# Patient Record
Sex: Male | Born: 1955
Health system: Southern US, Community
[De-identification: ages and names within clinical notes are randomized; demographics above are authoritative.]

## PROBLEM LIST (undated history)

## (undated) DIAGNOSIS — M25561 Pain in right knee: Secondary | ICD-10-CM

## (undated) DIAGNOSIS — E785 Hyperlipidemia, unspecified: Secondary | ICD-10-CM

## (undated) DIAGNOSIS — I10 Essential (primary) hypertension: Secondary | ICD-10-CM

## (undated) HISTORY — DX: Pain in right knee: M25.561

## (undated) HISTORY — DX: Hyperlipidemia, unspecified: E78.5

## (undated) HISTORY — PX: APPENDECTOMY: SHX54

## (undated) HISTORY — DX: Essential (primary) hypertension: I10

## (undated) HISTORY — PX: VASECTOMY: SHX75

---

## 1998-08-06 ENCOUNTER — Encounter: Payer: Self-pay | Admitting: General Surgery

## 1998-08-06 ENCOUNTER — Ambulatory Visit (HOSPITAL_COMMUNITY): Admission: RE | Admit: 1998-08-06 | Discharge: 1998-08-06 | Payer: Self-pay | Admitting: General Surgery

## 1998-08-06 ENCOUNTER — Inpatient Hospital Stay (HOSPITAL_COMMUNITY): Admission: EM | Admit: 1998-08-06 | Discharge: 1998-08-11 | Payer: Self-pay | Admitting: Emergency Medicine

## 1999-10-10 ENCOUNTER — Ambulatory Visit (HOSPITAL_COMMUNITY): Admission: RE | Admit: 1999-10-10 | Discharge: 1999-10-10 | Payer: Self-pay | Admitting: Internal Medicine

## 2001-09-07 ENCOUNTER — Ambulatory Visit (HOSPITAL_COMMUNITY): Admission: RE | Admit: 2001-09-07 | Discharge: 2001-09-07 | Payer: Self-pay | Admitting: Family Medicine

## 2001-09-07 ENCOUNTER — Encounter: Payer: Self-pay | Admitting: Family Medicine

## 2001-09-13 ENCOUNTER — Encounter: Admission: RE | Admit: 2001-09-13 | Discharge: 2001-09-20 | Payer: Self-pay | Admitting: Family Medicine

## 2007-05-27 ENCOUNTER — Ambulatory Visit (HOSPITAL_COMMUNITY): Admission: RE | Admit: 2007-05-27 | Discharge: 2007-05-27 | Payer: Self-pay | Admitting: Gastroenterology

## 2007-10-25 ENCOUNTER — Emergency Department (HOSPITAL_COMMUNITY): Admission: EM | Admit: 2007-10-25 | Discharge: 2007-10-25 | Payer: Self-pay | Admitting: Family Medicine

## 2008-02-10 ENCOUNTER — Ambulatory Visit (HOSPITAL_COMMUNITY): Admission: RE | Admit: 2008-02-10 | Discharge: 2008-02-10 | Payer: Self-pay | Admitting: Orthopedic Surgery

## 2010-02-14 ENCOUNTER — Emergency Department (HOSPITAL_COMMUNITY)
Admission: EM | Admit: 2010-02-14 | Discharge: 2010-02-14 | Payer: Self-pay | Source: Home / Self Care | Admitting: Emergency Medicine

## 2010-02-18 LAB — URINALYSIS, ROUTINE W REFLEX MICROSCOPIC
Bilirubin Urine: NEGATIVE
Hgb urine dipstick: NEGATIVE
Ketones, ur: NEGATIVE mg/dL
Nitrite: NEGATIVE
Protein, ur: NEGATIVE mg/dL
Specific Gravity, Urine: 1.008 (ref 1.005–1.030)
Urine Glucose, Fasting: NEGATIVE mg/dL
Urobilinogen, UA: 0.2 mg/dL (ref 0.0–1.0)
pH: 6.5 (ref 5.0–8.0)

## 2010-02-18 LAB — DIFFERENTIAL
Basophils Absolute: 0 10*3/uL (ref 0.0–0.1)
Basophils Relative: 1 % (ref 0–1)
Eosinophils Absolute: 0.2 10*3/uL (ref 0.0–0.7)
Eosinophils Relative: 3 % (ref 0–5)
Lymphocytes Relative: 40 % (ref 12–46)
Lymphs Abs: 1.9 10*3/uL (ref 0.7–4.0)
Monocytes Absolute: 0.4 10*3/uL (ref 0.1–1.0)
Monocytes Relative: 9 % (ref 3–12)
Neutro Abs: 2.2 10*3/uL (ref 1.7–7.7)
Neutrophils Relative %: 46 % (ref 43–77)

## 2010-02-18 LAB — POCT I-STAT, CHEM 8
BUN: 17 mg/dL (ref 6–23)
Calcium, Ion: 1.11 mmol/L — ABNORMAL LOW (ref 1.12–1.32)
Chloride: 102 mEq/L (ref 96–112)
Creatinine, Ser: 1.3 mg/dL (ref 0.4–1.5)
Glucose, Bld: 158 mg/dL — ABNORMAL HIGH (ref 70–99)
HCT: 41 % (ref 39.0–52.0)
Hemoglobin: 13.9 g/dL (ref 13.0–17.0)
Potassium: 3.7 mEq/L (ref 3.5–5.1)
Sodium: 137 mEq/L (ref 135–145)
TCO2: 25 mmol/L (ref 0–100)

## 2010-02-18 LAB — CBC
HCT: 38.7 % — ABNORMAL LOW (ref 39.0–52.0)
Hemoglobin: 13.9 g/dL (ref 13.0–17.0)
MCH: 31.6 pg (ref 26.0–34.0)
MCHC: 35.9 g/dL (ref 30.0–36.0)
MCV: 88 fL (ref 78.0–100.0)
Platelets: 222 10*3/uL (ref 150–400)
RBC: 4.4 MIL/uL (ref 4.22–5.81)
RDW: 11.7 % (ref 11.5–15.5)
WBC: 4.7 10*3/uL (ref 4.0–10.5)

## 2010-02-18 LAB — GLUCOSE, CAPILLARY: Glucose-Capillary: 149 mg/dL — ABNORMAL HIGH (ref 70–99)

## 2015-03-09 MED FILL — ZOLPIDEM TARTRATE 5 MG TAB: 5 | 30 days supply | Qty: 30 | Fill #0

## 2015-03-16 MED FILL — VALSARTAN 160 MG TABLET: 160 | 90 days supply | Qty: 90 | Fill #0

## 2015-05-02 MED FILL — HYDROCHLOROTHIAZIDE 12.5 MG: 12.5 | 90 days supply | Qty: 90 | Fill #0

## 2015-05-02 MED FILL — AMLODIPINE BESYLATE 5 MG TA: 5 | 90 days supply | Qty: 90 | Fill #0

## 2015-05-04 DIAGNOSIS — H524 Presbyopia: Secondary | ICD-10-CM | POA: Diagnosis not present

## 2015-05-04 DIAGNOSIS — H5213 Myopia, bilateral: Secondary | ICD-10-CM | POA: Diagnosis not present

## 2015-05-04 DIAGNOSIS — H52223 Regular astigmatism, bilateral: Secondary | ICD-10-CM | POA: Diagnosis not present

## 2015-06-11 DIAGNOSIS — E782 Mixed hyperlipidemia: Secondary | ICD-10-CM | POA: Diagnosis not present

## 2015-06-11 DIAGNOSIS — G4700 Insomnia, unspecified: Secondary | ICD-10-CM | POA: Diagnosis not present

## 2015-06-11 DIAGNOSIS — Z0000 Encounter for general adult medical examination without abnormal findings: Secondary | ICD-10-CM | POA: Diagnosis not present

## 2015-06-11 DIAGNOSIS — I10 Essential (primary) hypertension: Secondary | ICD-10-CM | POA: Diagnosis not present

## 2015-06-11 DIAGNOSIS — Z125 Encounter for screening for malignant neoplasm of prostate: Secondary | ICD-10-CM | POA: Diagnosis not present

## 2015-06-11 DIAGNOSIS — B360 Pityriasis versicolor: Secondary | ICD-10-CM | POA: Diagnosis not present

## 2015-06-11 DIAGNOSIS — Z91030 Bee allergy status: Secondary | ICD-10-CM | POA: Diagnosis not present

## 2015-06-11 DIAGNOSIS — N529 Male erectile dysfunction, unspecified: Secondary | ICD-10-CM | POA: Diagnosis not present

## 2015-06-11 DIAGNOSIS — Z79899 Other long term (current) drug therapy: Secondary | ICD-10-CM | POA: Diagnosis not present

## 2015-06-11 MED FILL — ROSUVASTATIN CALCIUM 20 MG: 20 | 90 days supply | Qty: 90 | Fill #0

## 2015-06-11 MED FILL — VALSARTAN 160 MG TABLET: 160 | 90 days supply | Qty: 90 | Fill #0

## 2015-06-11 MED FILL — EPINEPHRINE 0.3 MG AUTO-INJ: 0.3 | 30 days supply | Qty: 2 | Fill #0

## 2015-07-26 MED FILL — HYDROCHLOROTHIAZIDE 12.5 MG: 12.5 | 90 days supply | Qty: 90 | Fill #1

## 2015-08-23 MED FILL — AMLODIPINE BESYLATE 5 MG TA: 5 | 90 days supply | Qty: 90 | Fill #1

## 2015-09-14 MED FILL — VALSARTAN 160 MG TABLET: 160 | 90 days supply | Qty: 90 | Fill #1

## 2015-09-14 MED FILL — ZOLPIDEM TARTRATE 5 MG TAB: 5 | 30 days supply | Qty: 30 | Fill #0

## 2015-09-20 DIAGNOSIS — S83241A Other tear of medial meniscus, current injury, right knee, initial encounter: Secondary | ICD-10-CM | POA: Diagnosis not present

## 2015-09-20 DIAGNOSIS — M7711 Lateral epicondylitis, right elbow: Secondary | ICD-10-CM | POA: Diagnosis not present

## 2015-09-25 ENCOUNTER — Ambulatory Visit: Payer: 59 | Attending: Sports Medicine | Admitting: Physical Therapy

## 2015-09-25 ENCOUNTER — Other Ambulatory Visit (HOSPITAL_COMMUNITY): Payer: Self-pay | Admitting: Sports Medicine

## 2015-09-25 ENCOUNTER — Encounter: Payer: Self-pay | Admitting: Physical Therapy

## 2015-09-25 DIAGNOSIS — M25561 Pain in right knee: Secondary | ICD-10-CM

## 2015-09-25 DIAGNOSIS — M6281 Muscle weakness (generalized): Secondary | ICD-10-CM | POA: Diagnosis present

## 2015-09-25 DIAGNOSIS — M25621 Stiffness of right elbow, not elsewhere classified: Secondary | ICD-10-CM | POA: Insufficient documentation

## 2015-09-25 DIAGNOSIS — M25521 Pain in right elbow: Secondary | ICD-10-CM | POA: Diagnosis present

## 2015-09-25 NOTE — Therapy (Signed)
[facility], [place], [address] Phone: [phone]   Fax:  [phone]  Physical Therapy Evaluation  Patient Details  Name: Chris Russell MRN: [id number] Date of Birth: 1955/02/14 Referring Provider: Dr Berle Mull   Encounter Date: 09/25/2015      PT End of Session - 09/25/15 1656    Visit Number 1   Number of Visits 12   Date for PT Re-Evaluation 11/06/15   Authorization Type [facility]    PT Start Time 1100   PT Stop Time 1147   PT Time Calculation (min) 47 min   Activity Tolerance Patient tolerated treatment well   Behavior During Therapy [facility] for tasks assessed/performed      Past Medical History:  Diagnosis Date  . Right knee pain     History reviewed. No pertinent surgical history.  There were no vitals filed for this visit.       Subjective Assessment - 09/25/15 1109    Subjective Patient has a history of right lateral elbow pain. Heused to play racquet ball. In the past the pain resolved. This past episode the pain has not resolved. He has pain when he is using his arm for the mouse at work. He is a Software engineer at [employer].     Pertinent History Pat history of lateral epicondylitis    Limitations Other (comment)  Using right upper extremity    How long can you sit comfortably? N/A    How long can you stand comfortably? N/A    How long can you walk comfortably? N/A    Diagnostic tests X-ray: (-) per patient    Patient Stated Goals Pain free and an exercise program    Currently in Pain? Yes   Pain Score 3    Pain Location Elbow   Pain Orientation Right   Pain Descriptors / Indicators Aching   Pain Type Acute pain  acute onset of pain    Pain Onset More than a month ago   Pain Frequency Intermittent   Aggravating Factors  use of the right elbow    Pain Relieving Factors rest    Effect of Pain on Daily Activities difficulty using his right arm at work             [facility] PT  Assessment - 09/25/15 0001      Assessment   Medical Diagnosis Right lateral epicondylitis    Referring Provider Dr Berle Mull    Onset Date/Surgical Date --  > 2 months prior    Hand Dominance Right   Next MD Visit --  Nothing for the elbow    Prior Therapy Not for elbow      Precautions   Precautions None     Restrictions   Weight Bearing Restrictions No     Balance Screen   Has the patient fallen in the past 6 months No     Home Environment   Additional Comments Nothing significant      Prior Function   Level of Independence Independent   Vocation Full time employment     Observation/Other Assessments   Observations Nothing signficant      Sensation   Light Touch Appears Intact   Additional Comments no radicualr signs      Posture/Postural Control   Posture Comments good posture      ROM / Strength   AROM / PROM / Strength AROM;PROM;Strength     AROM   Overall AROM Comments Pain  with active end range wrist extension; 75 degrees of active right lebow supination      PROM   Overall PROM Comments WNL passive ROM      Strength   Overall Strength Comments right: wrist extension 4/5; flexion 5/5 supination 4+/5 Grip R 60lbs  L 80lbs      Palpation   Palpation comment tenderness to palpation in the lateral epicondyle and psoteriro elbow                    [facility] Adult PT Treatment/Exercise - 09/25/15 0001      Wrist Exercises   Other wrist exercises wrist flexion 2x10 2lb wrist extension 2x10 1lb; supination/ pronation 2lb 2x10; towel squeeze 2x10; lateral epicondyle stretch ;                 PT Education - 09/25/15 1656    Education provided Yes   Education Details HEP; activity modification; use of iontophoresis   Person(s) Educated Patient   Methods Explanation   Comprehension Verbalized understanding;Returned demonstration          PT Short Term Goals - 09/25/15 1657      PT SHORT TERM GOAL #1   Title Patient will  demsotrate full pain free active ROM of the right upper extremity   Time 3   Period Weeks   Status New     PT SHORT TERM GOAL #2   Title Patient will demsotrate 5/5 right upper extremity strength    Time 3   Period Weeks   Status New     PT SHORT TERM GOAL #3   Title Patient will increase grip strength on the right to 70 lbs    Time 3   Period Weeks   Status New     PT SHORT TERM GOAL #4   Title Patient will be independent with initial HEP    Time 3   Period Weeks   Status New           PT Long Term Goals - 09/25/15 1700      PT LONG TERM GOAL #1   Title Patient will return to full work out program without pain    Time 6   Period Weeks   Status New     PT LONG TERM GOAL #2   Title Patient will use his right upper extremity for daily tasks without pain   Time 6   Period Weeks   Status New     PT LONG TERM GOAL #3   Title Patient will demsotrate an 80lb grip on the left in order to carry items without pain   Time 6   Period Weeks   Status New               Plan - 09/25/15 1409    Clinical Impression Statement Patient is a 60 year old male with a history of right lateral elbow pain which has become more consistent over the past few months. His pain increase with use of her right arm. He has limitation s in strength and small limitations with pain free active rang of motion. He would benefit from skilled therapy to decrease pain. he would also benefit from an exercise program that helps him return to an active lifestyle without increased elow pain.    Rehab Potential Excellent   PT Frequency 2x / week   PT Duration 8 weeks   PT Treatment/Interventions ADLs/Self Care Home Management;Cryotherapy;Electrical Stimulation;Iontophoresis 4mg /ml Dexamethasone;Moist Heat;Ultrasound;Gait  training;Stair training;Functional mobility training;Patient/family education;Neuromuscular re-education;Therapeutic exercise;Therapeutic activities;Passive range of motion;Manual  techniques;Dry needling;Taping;Splinting   PT Next Visit Plan continue Iontophoresis; Continue with manual therapy; add band exercises for pushing and pulling. consider velcro board fro pronation/ supination and ring/ middle finger extension; Dry needling were needed.    PT Home Exercise Plan wrist flexion, extension, elbow supination, towel squeeze    Consulted and Agree with Plan of Care Patient      Patient will benefit from skilled therapeutic intervention in order to improve the following deficits and impairments:  Pain, Decreased strength  Visit Diagnosis: Pain in right elbow - Plan: PT PLAN OF CARE CERT/RE-CERT  Muscle weakness (generalized) - Plan: PT PLAN OF CARE CERT/RE-CERT  Stiffness of right elbow, not elsewhere classified - Plan: PT PLAN OF CARE CERT/RE-CERT     Problem List There are no active problems to display for this patient.   Carney Living PT DPT  09/25/2015, 5:11 PM  [facility] [address] [place], [place], [address] Phone: [phone]   Fax:  [phone]  Name: Chris Russell MRN: [id number] Date of Birth: 1955/03/26

## 2015-10-02 ENCOUNTER — Ambulatory Visit (HOSPITAL_COMMUNITY)
Admission: RE | Admit: 2015-10-02 | Discharge: 2015-10-02 | Disposition: A | Discharge status: Home / Self Care | Payer: 59 | Source: Ambulatory Visit | Attending: Sports Medicine | Admitting: Sports Medicine

## 2015-10-02 ENCOUNTER — Ambulatory Visit: Payer: 59 | Admitting: Physical Therapy

## 2015-10-02 DIAGNOSIS — M25461 Effusion, right knee: Secondary | ICD-10-CM | POA: Insufficient documentation

## 2015-10-02 DIAGNOSIS — M25621 Stiffness of right elbow, not elsewhere classified: Secondary | ICD-10-CM | POA: Diagnosis not present

## 2015-10-02 DIAGNOSIS — M6281 Muscle weakness (generalized): Secondary | ICD-10-CM | POA: Diagnosis not present

## 2015-10-02 DIAGNOSIS — M94261 Chondromalacia, right knee: Secondary | ICD-10-CM | POA: Diagnosis not present

## 2015-10-02 DIAGNOSIS — M238X1 Other internal derangements of right knee: Secondary | ICD-10-CM | POA: Insufficient documentation

## 2015-10-02 DIAGNOSIS — M25561 Pain in right knee: Secondary | ICD-10-CM | POA: Diagnosis present

## 2015-10-02 DIAGNOSIS — M25521 Pain in right elbow: Secondary | ICD-10-CM | POA: Diagnosis not present

## 2015-10-02 NOTE — Therapy (Signed)
[place], [place], [address] Phone: [phone]   Fax:  [phone]  Physical Therapy Treatment  Patient Details  Name: Chris Russell MRN: [id number] Date of Birth: [birth year] Referring Provider: Dr Berle Mull   Encounter Date: 10/02/2015      PT End of Session - 10/02/15 0845    Visit Number 2   Number of Visits 12   Date for PT Re-Evaluation 11/06/15   Authorization Type [facility]    PT Start Time 0845   PT Stop Time 0944   PT Time Calculation (min) 59 min   Activity Tolerance Patient tolerated treatment well   Behavior During Therapy [facility] for tasks assessed/performed      Past Medical History:  Diagnosis Date  . Right knee pain     No past surgical history on file.  There were no vitals filed for this visit.      Subjective Assessment - 10/02/15 0847    Subjective I usually have pain while I am using the computer and working.  I have no pain at rest. I have had tennis elbow playing racquetball in my past. I no longer play.  20 years ago   Pertinent History Pat history of lateral epicondylitis    Diagnostic tests X-ray: (-) per patient    Patient Stated Goals Pain free and an exercise program    Currently in Pain? Yes   Pain Score 2    Pain Location Elbow   Pain Orientation Right   Pain Descriptors / Indicators Aching   Pain Type Acute pain   Pain Onset More than a month ago   Pain Frequency Intermittent                         [facility] Adult PT Treatment/Exercise - 10/02/15 0858      Wrist Exercises   Other wrist exercises wrist flexion 2x10 2lb wrist extension 2x10 2lb; supination/ pronation 2lb 2x10; towel squeeze 2x10; lateral epicondyle stretch 15 - 30 seconds x 3 ;      Modalities   Modalities Moist Heat     Moist Heat Therapy   Number Minutes Moist Heat 15 Minutes   Moist Heat Location Elbow  right     Manual Therapy   Manual Therapy Soft tissue  mobilization   Soft tissue mobilization IASTYM to right wrist common extensors, triceps, supinator/pronator          Trigger Point Dry Needling - 10/02/15 0859    Consent Given? Yes   Education Handout Provided Yes  twitch response for R wrist common ext   Muscles Treated Upper Body --  right triceps , wrist common extensors              PT Education - 10/02/15 0857    Education provided Yes   Education Details Trigger point dry needling aftercare / precautians  review of exericise   Person(s) Educated Patient   Methods Explanation;Handout   Comprehension Verbalized understanding          PT Short Term Goals - 10/02/15 1048      PT SHORT TERM GOAL #1   Title Patient will demsotrate full pain free active ROM of the right upper extremity   Time 3   Period Weeks   Status On-going     PT SHORT TERM GOAL #2   Title Patient will demsotrate 5/5 right upper extremity strength    Time 3  Period Weeks   Status On-going     PT SHORT TERM GOAL #3   Title Patient will increase grip strength on the right to 70 lbs    Time 3   Period Weeks   Status On-going     PT SHORT TERM GOAL #4   Title Patient will be independent with initial HEP    Time 3   Period Weeks   Status On-going           PT Long Term Goals - 09/25/15 1700      PT LONG TERM GOAL #1   Title Patient will return to full work out program without pain    Time 6   Period Weeks   Status New     PT LONG TERM GOAL #2   Title Patient will use his right upper extremity for daily tasks without pain   Time 6   Period Weeks   Status New     PT LONG TERM GOAL #3   Title Patient will demsotrate an 80lb grip on the left in order to carry items without pain   Time 6   Period Weeks   Status New               Plan - 10/02/15 [id number]    Clinical Impression Statement Pt returns with same symptoms of right lateral epicondylitis.  Pt consents to trigger point dry needling for Right common wrist  extensors, supinator/pronator and triceps on right.  Pt  was closely monitored throughtout session.  Pt reviewd initial HEP wit 2 lb wts.  Will continue trial of dry needling for next 2 sessions before returning to [facility] PT.     Rehab Potential Excellent   PT Frequency 2x / week   PT Duration 8 weeks   PT Treatment/Interventions ADLs/Self Care Home Management;Cryotherapy;Electrical Stimulation;Iontophoresis 4mg /ml Dexamethasone;Moist Heat;Ultrasound;Gait training;Stair training;Functional mobility training;Patient/family education;Neuromuscular re-education;Therapeutic exercise;Therapeutic activities;Passive range of motion;Manual techniques;Dry needling;Taping;Splinting   PT Next Visit Plan Continue with dry needling and assess next visit Continue with manual therapy; add band exercises for pushing and pulling. consider velcro board fro pronation/ supination and ring/ middle finger extension; Dry needling were needed.    PT Home Exercise Plan wrist flexion, extension, elbow supination, towel squeeze    Consulted and Agree with Plan of Care Patient      Patient will benefit from skilled therapeutic intervention in order to improve the following deficits and impairments:  Pain, Decreased strength  Visit Diagnosis: Pain in right elbow  Muscle weakness (generalized)  Stiffness of right elbow, not elsewhere classified     Problem List There are no active problems to display for this patient.  Voncille Lo, PT 10/02/15 10:50 AM Phone: [phone] Fax: [facility] [address] [place], [place], [address] Phone: [phone]   Fax:  [phone]  Name: Chris Russell MRN: [id number] Date of Birth: [birth year]

## 2015-10-02 NOTE — Patient Instructions (Signed)
Trigger Point Dry Needling  . What is Trigger Point Dry Needling (DN)? o DN is a physical therapy technique used to treat muscle pain and dysfunction. Specifically, DN helps deactivate muscle trigger points (muscle knots).  o A thin filiform needle is used to penetrate the skin and stimulate the underlying trigger point. The goal is for a local twitch response (LTR) to occur and for the trigger point to relax. No medication of any kind is injected during the procedure.   . What Does Trigger Point Dry Needling Feel Like?  o The procedure feels different for each individual patient. Some patients report that they do not actually feel the needle enter the skin and overall the process is not painful. Very mild bleeding may occur. However, many patients feel a deep cramping in the muscle in which the needle was inserted. This is the local twitch response.   Marland Kitchen How Will I feel after the treatment? o Soreness is normal, and the onset of soreness may not occur for a few hours. Typically this soreness does not last longer than two days.  o Bruising is uncommon, however; ice can be used to decrease any possible bruising.  o In rare cases feeling tired or nauseous after the treatment is normal. In addition, your symptoms may get worse before they get better, this period will typically not last longer than 24 hours.   . What Can I do After My Treatment? o Increase your hydration by drinking more water for the next 24 hours. o You may place ice or heat on the areas treated that have become sore, however, do not use heat on inflamed or bruised areas. Heat often brings more relief post needling. o You can continue your regular activities, but vigorous activity is not recommended initially after the treatment for 24 hours. o DN is best combined with other physical therapy such as strengthening, stretching, and other therapies.     Chris Russell, PT 10/02/15 8:49 AM Phone: [phone] Fax:  [phone]

## 2015-10-03 MED FILL — IBUPROFEN 600 MG TABLET: 600 | 20 days supply | Qty: 60 | Fill #0

## 2015-10-11 ENCOUNTER — Encounter: Payer: 59 | Admitting: Physical Therapy

## 2015-10-11 ENCOUNTER — Ambulatory Visit: Payer: 59 | Attending: Sports Medicine | Admitting: Physical Therapy

## 2015-10-11 DIAGNOSIS — M25621 Stiffness of right elbow, not elsewhere classified: Secondary | ICD-10-CM | POA: Insufficient documentation

## 2015-10-11 DIAGNOSIS — M25521 Pain in right elbow: Secondary | ICD-10-CM | POA: Diagnosis not present

## 2015-10-11 DIAGNOSIS — M25561 Pain in right knee: Secondary | ICD-10-CM | POA: Insufficient documentation

## 2015-10-11 DIAGNOSIS — M6281 Muscle weakness (generalized): Secondary | ICD-10-CM | POA: Insufficient documentation

## 2015-10-11 NOTE — Patient Instructions (Addendum)
[email or web address]  Tennis Elbow  Using flex  Bar.  Get a red or green flex bar. I own a red.  Elbow Extension: Resisted    With tubing wrapped around left fist and other end anchored, straighten elbow. Repeat __15__ times per set. Do __2__ sets per session. Do _2___ sessions per day.  Copyright  VHI. All rights reserved.     Copyright  VHI. All rights reserved.  HIP: Flexion / KNEE: Extension, Straight Leg Raise    All knee exericses should be pain free.   Raise leg, keeping knee straight. Perform slowly. _15__ reps per set, __2_ sets per day, _7__ days per week   Copyright  VHI. All rights reserved.  Heel Slide   Bend knee and pull heel toward buttocks. Hold _3-5___ seconds. Return. Repeat with other knee. Repeat __15__ times. Do _1-2___ sessions per day.  http://[email or web address].exer.us/372   Copyright  VHI. All rights reserved.     Raise leg until knee is straight. _15__ reps per set, _2__ sets per day, _7__ days per week   Copyright  VHI. All rights reserved.  [employer] a large can or rolled towel under leg. Straighten knee and leg. Hold _5___ seconds. Repeat with other leg. Repeat _15 x2___ times.  http://[email or web address].exer.us/365   Copyright  VHI. All rights reserved.  Quad Set   Slowly tighten muscles on thigh of straight leg while counting out loud to _5___. Repeat with other leg. Repeat __15 x 2__ times. Do __2__ sessions per day. When watching TV or reading remember to do quad sets.  Leg Extension (Hamstring)   Sit toward front edge of chair, with leg out straight, heel on floor, toes pointing toward body. Keeping back straight, bend forward at hip, breathing out through pursed lips. Return, breathing in. Repeat __2-3_ times. Repeat with other leg. Do _1-2__ sessions per day. Chris Russell the baby Variation: Perform from standing position, with support.     Hamstring Stretch   With other leg bent, foot flat, grasp right leg and  slowly try to straighten knee. Hold _30___ seconds. Repeat _2-3___ times. Do _1-2___ sessions per day.  http://[email or web address].exer.us/279   Hamstring Stretch (Standing)   Standing, place one heel on chair or bench. Use one or both hands on thigh for support. Keeping torso straight, lean forward slowly until a stretch is felt in back of same thigh. Hold ____ seconds. Repeat with other leg.  IONTOPHORESIS PATIENT PRECAUTIONS & CONTRAINDICATIONS:  . Redness under one or both electrodes can occur.  This characterized by a uniform redness that usually disappears within 12 hours of treatment. . Small pinhead size blisters may result in response to the drug.  Contact your physician if the problem persists more than 24 hours. . On rare occasions, iontophoresis therapy can result in temporary skin reactions such as rash, inflammation, irritation or burns.  The skin reactions may be the result of individual sensitivity to the ionic solution used, the condition of the skin at the start of treatment, reaction to the materials in the electrodes, allergies or sensitivity to dexamethasone, or a poor connection between the patch and your skin.  Discontinue using iontophoresis if you have any of these reactions and report to your therapist. . Remove the Patch or electrodes if you have any undue sensation of pain or burning during the treatment and report discomfort to your therapist. . Tell your Therapist if you have had known adverse reactions to the application of electrical current. . If using the Patch,  the LED light will turn off when treatment is complete and the patch can be removed.  Approximate treatment time is 1-3 hours.  Remove the patch when light goes off or after 6 hours. . The Patch can be worn during normal activity, however excessive motion where the electrodes have been placed can cause poor contact between the skin and the electrode or uneven electrical current resulting in greater risk of skin  irritation. Marland Kitchen Keep out of the reach of children.   . DO NOT use if you have a cardiac pacemaker or any other electrically sensitive implanted device. . DO NOT use if you have a known sensitivity to dexamethasone. . DO NOT use during Magnetic Resonance Imaging (MRI). . DO NOT use over broken or compromised skin (e.g. sunburn, cuts, or acne) due to the increased risk of skin reaction. . DO NOT SHAVE over the area to be treated:  To establish good contact between the Patch and the skin, excessive hair may be clipped. . DO NOT place the Patch or electrodes on or over your eyes, directly over your heart, or brain. . DO NOT reuse the Patch or electrodes as this may cause burns to occur.            Copyright  VHI. All rights reserved.  Chris Russell, PT 10/11/15 11:20 AM Phone: [phone] Fax: [phone]

## 2015-10-11 NOTE — Therapy (Signed)
[place], [place], [address] Phone: [phone]   Fax:  [phone]  Physical Therapy Treatment/re evaluation   Patient Details  Name: Chris Russell MRN: [id number] Date of Birth: 03-05-1955 Referring Provider: Dr. Berle Mull  Encounter Date: 10/11/2015      PT End of Session - 10/11/15 1350    Visit Number 3   Number of Visits 15   Date for PT Re-Evaluation 11/22/15   Authorization Type [facility]    PT Start Time 1100   PT Stop Time 1150   PT Time Calculation (min) 50 min   Activity Tolerance Patient tolerated treatment well   Behavior During Therapy [facility] for tasks assessed/performed      Past Medical History:  Diagnosis Date  . Right knee pain     No past surgical history on file.  There were no vitals filed for this visit.      Subjective Assessment - 10/11/15 1123    Subjective My knee has been bothering me for 2 months.  I had an MRI August 29th for knee.  I have R Knee OA and MCL sprain. My arm is better but pain at triceps today   Pertinent History Pat history of lateral epicondylitis    How long can you sit comfortably? N/A    How long can you stand comfortably? 8 hours but I feel it at end of day   Currently in Pain? Yes   Pain Score 1    Pain Location Elbow   Pain Orientation Right   Pain Descriptors / Indicators Aching   Pain Type Acute pain   Pain Onset More than a month ago   Multiple Pain Sites Yes   Pain Score 8   Pain Location Knee  MCL strain   Pain Orientation Right   Pain Descriptors / Indicators Sore;Sharp   Pain Type Acute pain   Pain Onset More than a month ago   Pain Frequency Intermittent   Aggravating Factors  moving my knee in certain positions at an angle,  I can go up and down stairs.  mostly angles it hurts            [facility] PT Assessment - 10/11/15 1139      Assessment   Medical Diagnosis Right lateral epicondylitis and Left  knee OA and MCL  sprain   Referring Provider Dr. Berle Mull   Hand Dominance Right   Prior Therapy currently for elbow      Precautions   Precautions None     Restrictions   Weight Bearing Restrictions No     Balance Screen   Has the patient fallen in the past 6 months No     Prior Function   Level of Independence Independent   Vocation Full time employment  desk work     Cognition   Overall Cognitive Status Within Functional Limits for tasks assessed     AROM   Overall AROM Comments No pain with right common writs extensors but pain with triceps insertiion   Right Knee Extension 0   Right Knee Flexion 135  ERP pain over MCL   Left Knee Extension 0   Left Knee Flexion 142     Strength   Overall Strength Within functional limits for tasks performed   Right Hip Flexion 5/5   Right Hip Extension 4+/5   Right Hip ABduction 4+/5   Left Hip Flexion 5/5   Left Hip Extension 4+/5  Left Hip ABduction 5/5   Right Knee Flexion 5/5   Right Knee Extension 4/5  limited by minimal pain but can shoot up with certain moveme   Left Knee Flexion 5/5   Left Knee Extension 5/5     Palpation   Palpation comment tenderness over right triceps insertion and right knee MCL                     [facility] Adult PT Treatment/Exercise - 10/11/15 1336      Self-Care   Self-Care Other Self-Care Comments   Other Self-Care Comments  use of flex bar and given web site  for education and use at home ([employer] TWIST)     Elbow Exercises   Wrist Extension Limitations given flex bar to use for eccentric control of wrist common extensors x 10     Knee/Hip Exercises: Seated   Long Arc Quad Right;Strengthening;15 reps   Heel Slides Right;10 reps;Strengthening     Knee/Hip Exercises: Supine   Quad Sets 20 reps;Right;Strengthening   Short Arc Quad Sets 15 reps   Heel Slides 15 reps;Right;Strengthening;AROM   Straight Leg Raises 15 reps;Strengthening;Right     Wrist Exercises   Other wrist exercises  triceps with green t band 15 x  over head      Iontophoresis   Type of Iontophoresis Dexamethasone   Location right MCL   Dose 1cc   Time 4 hour patch to be removed by pt after 4 hours                PT Education - 10/11/15 1345    Education provided Yes   Education Details POC, explanation of findings for Right knee pain and intial HEP and iontophoresis explanation and home use with handout   Person(s) Educated Patient   Methods Explanation;Demonstration;Handout;Tactile cues;Verbal cues   Comprehension Verbalized understanding;Returned demonstration          PT Short Term Goals - 10/11/15 1357      PT SHORT TERM GOAL #1   Title Patient will demsotrate full pain free active ROM of the right upper extremity   Baseline 1/10   Time 3   Period Weeks   Status On-going     PT SHORT TERM GOAL #2   Title Patient will demsotrate 5/5 right upper extremity strength    Time 3   Period Weeks   Status On-going     PT SHORT TERM GOAL #3   Title Patient will increase grip strength on the right to 70 lbs    Baseline to be taken next visit   Time 3   Period Weeks   Status On-going     PT SHORT TERM GOAL #4   Title Patient will be independent with initial HEP for knee and right wrist/elbow   Time 3   Period Weeks   Status On-going           PT Long Term Goals - 10/11/15 1358      PT LONG TERM GOAL #1   Title Patient will return to full work out program without pain in elbow   Time 6   Period Weeks   Status New     PT LONG TERM GOAL #2   Title Patient will use his right upper extremity for daily tasks without pain   Time 6   Period Weeks   Status On-going     PT LONG TERM GOAL #3   Title Patient will demsotrate an 80lb  grip on the left in order to carry items without pain   Time 6   Period Weeks   Status On-going     PT LONG TERM GOAL #4   Title Pt will be independent with advanced HEP  for knee   Time 6   Period Weeks   Status New     PT LONG TERM  GOAL #5   Title Pt will be able to demonstrate cutting manuevers without illicting pain in right knee to return to active lifestyle   Time 6   Period Weeks   Status New               Plan - 10/11/15 1351    Clinical Impression Statement Pt returns to clinic with improved R lateral epicondylitis symptoms 1/10 but triceps insertion pain. continuing treatment for right tennis elbow. Pt is reevaluated to add Right knee pain.  Pt has MRI that showed R OA and MCL Sprain.  Pt presents with pain on medial knee and complains of pain when moving knee in  diagonals.  He is able to participate in exericise and walking/ activities but has noticed increasing pain over last 2 months.  Pt does not note any speciific incident but would like to improve function and be able to move painfree. Pt would like to be able to return to activity without knee pain/ cutting manuevers and to have decreased pain at the end of the day. After sitting for work. Will continue with skilled PT to increase strength and neuroreeducation of right elbow and knee for maximum function   Rehab Potential Excellent   PT Frequency 2x / week   PT Duration 6 weeks   PT Treatment/Interventions ADLs/Self Care Home Management;Cryotherapy;Electrical Stimulation;Iontophoresis 4mg /ml Dexamethasone;Moist Heat;Ultrasound;Gait training;Stair training;Functional mobility training;Patient/family education;Neuromuscular re-education;Therapeutic exercise;Therapeutic activities;Passive range of motion;Manual techniques;Dry needling;Taping;Splinting   PT Next Visit Plan Continue with increasing strength for right common extensors and right knee to optimal function for MCL sprain.  Assess Iontophoresis benefit   PT Home Exercise Plan Level 1 knee exercise.  Eccentric flex bar exericises for common wrist extensors   Consulted and Agree with Plan of Care Patient      Patient will benefit from skilled therapeutic intervention in order to improve the  following deficits and impairments:  Pain, Decreased strength, Decreased range of motion  Visit Diagnosis: Pain in right elbow  Muscle weakness (generalized)  Stiffness of right elbow, not elsewhere classified  Pain in right knee     Problem List There are no active problems to display for this patient.  Voncille Lo, PT 10/11/15 2:02 PM Phone: [phone] Fax: [phone]  By signing I understand that I am ordering/authorizing the use of Iontophoresis using 4 mg/mL of dexamethasone as a component of this plan of care.  [place], [place], [address] Phone: [phone]   Fax:  [phone]  Name: Chris Russell MRN: [id number] Date of Birth: [birth year]

## 2015-10-16 ENCOUNTER — Ambulatory Visit: Payer: 59 | Admitting: Physical Therapy

## 2015-10-16 DIAGNOSIS — M6281 Muscle weakness (generalized): Secondary | ICD-10-CM

## 2015-10-16 DIAGNOSIS — M25621 Stiffness of right elbow, not elsewhere classified: Secondary | ICD-10-CM | POA: Diagnosis not present

## 2015-10-16 DIAGNOSIS — M25521 Pain in right elbow: Secondary | ICD-10-CM

## 2015-10-16 DIAGNOSIS — M25561 Pain in right knee: Secondary | ICD-10-CM

## 2015-10-16 NOTE — Patient Instructions (Signed)
   HIP: Abduction / External Rotation (Band)   Place band around knees. Lie on side with hips and knees bent. Raise top knee up, squeezing glutes. Keep feet together. Hold _5__ seconds. Use __Green ______ band. _15__ reps per set, _1_ sets per day, _6-7__ days per week  Bridge   Lie back, legs bent. Inhale, pressing hips up. Keeping ribs in, lengthen lower back. Exhale, rolling down along spine from top. Repeat _15___ times. Do _1___ sessions per day. You can add a 5 lb wt on hips   Bridging (Single Leg)   Lie on back with feet shoulder width apart and right leg straight. Lift hips toward the ceiling while keeping leg straight. Hold _5___ seconds. Repeat _15___ times. Do _1___ sessions per day.  http://[email or web address].exer.us/358   EXTENSION: Prone - Knee Flexed (Active)   Lie on stomach, right knee bent to 90. Lift leg toward ceiling. Use 5___ lbs. Complete _1__ sets of _15__ repetitions. Perform _1__ sessions per day.  http://gtsc.exer.us/66   Hip Extension (Prone)   Lift left leg __6__ inches from floor, keeping knee locked.   Repeat _15___ times per set. Do __1__ sets per session. Use 5 lb Do __1-2__ sessions per day.  Body-Weight Step-Up: Stable (Active)   Head up, back flat, place one foot on step. Bring other leg up on to step. Complete _1__ sets of 15-20___ repetitions. Slowly. Perform _1-2__ sessions per day.  http://gtsc.exer.us/512     http://orth.exer.us/734   Copyright  VHI. All rights reserved.  Strengthening: Wall Slide   Leaning on wall, slowly lower buttocks until thighs are parallel to floor. Hold __10__ seconds. Tighten thigh muscles and return. Do NOT let knee go over 2nd toe Repeat __10__ times per set. Do _1___ sets per session. Do _1-2___ sessions per day.  http://orth.exer.us/630   Copyright  VHI. All rights reserved.    Voncille Lo, PT 10/16/15 11:26 AM Phone: [phone] Fax: [phone]

## 2015-10-16 NOTE — Therapy (Signed)
[place], [place], [address] Phone: [phone]   Fax:  [phone]  Physical Therapy Treatment  Patient Details  Name: DEADRICK VERDUGO MRN: [id number] Date of Birth: May 11, 1955 Referring Provider: Dr. Berle Mull  Encounter Date: 10/16/2015      PT End of Session - 10/16/15 1213    Visit Number 4   Number of Visits 15   Date for PT Re-Evaluation 11/22/15   Authorization Type [facility]    PT Start Time 1100   PT Stop Time 1145   PT Time Calculation (min) 45 min   Activity Tolerance Patient tolerated treatment well   Behavior During Therapy [facility] for tasks assessed/performed      Past Medical History:  Diagnosis Date  . Right knee pain     No past surgical history on file.  There were no vitals filed for this visit.      Subjective Assessment - 10/16/15 1106    Subjective I didnt do my knee exercises but I did get my flex bar for my tennis elbow and I have been doing it.   Pertinent History Pat history of lateral epicondylitis    Limitations Other (comment)  using right upper extremity and knee but begin cutting   Currently in Pain? Yes   Pain Score 1    Pain Location Elbow   Pain Orientation Right   Pain Descriptors / Indicators Aching   Pain Type Acute pain   Pain Score 1   Pain Location Knee   Pain Orientation Right   Pain Descriptors / Indicators Sore                         [facility] Adult PT Treatment/Exercise - 10/16/15 1116      Self-Care   Self-Care Other Self-Care Comments   Other Self-Care Comments  reviewed use of flex bar / pt recieved in mail on Sunday     Knee/Hip Exercises: Standing   Forward Step Up 15 reps;Right;Hand Hold: 1   Forward Step Up Limitations up 3 sec and down 3 sec   Wall Squat 10 reps   Wall Squat Limitations hold for 10 sec     Knee/Hip Exercises: Seated   Long Arc Quad Strengthening;Right;1 set;15 reps;Weights   Long Arc Quad  Weight 5 lbs.   Heel Slides Right;10 reps   Hamstring Curl Right;Strengthening;1 set;15 reps;Weights    Hamstring Weights 5 lbs.     Knee/Hip Exercises: Supine   Quad Sets 20 reps;Right;Strengthening   Short Arc Quad Sets 15 reps   Short Arc Quad Sets Limitations 5 lb    Heel Slides 15 reps;Right;Strengthening;AROM   Single Leg Bridge Both;1 set;15 reps  pt with decreasd motor control for full AROM   Straight Leg Raises 15 reps;Strengthening;Right   Straight Leg Raises Limitations 5 lb   Other Supine Knee/Hip Exercises bridging with 5 lb wt x 15      Knee/Hip Exercises: Sidelying   Clams bil green t band 15 reps     Knee/Hip Exercises: Prone   Hip Extension Strengthening;Right;1 set;15 reps   Hip Extension Limitations 5 lb     Iontophoresis   Type of Iontophoresis Dexamethasone   Location right MCL   Dose 1cc   Time 4 hour patch to be removed by pt after 4 hours                PT Education - 10/16/15 1212  Education provided Yes   Education Details added to knee/ hip strength HEP ,    Person(s) Educated Patient   Methods Explanation;Demonstration;Tactile cues;Verbal cues;Handout   Comprehension Verbalized understanding;Returned demonstration          PT Short Term Goals - 10/16/15 1216      PT SHORT TERM GOAL #1   Baseline 1/10   Time 3   Period Weeks   Status On-going     PT SHORT TERM GOAL #2   Title Patient will demsotrate 5/5 right upper extremity strength    Time 3   Period Weeks   Status On-going     PT SHORT TERM GOAL #3   Title Patient will increase grip strength on the right to 70 lbs    Baseline to be taken next visit   Time 3   Period Weeks   Status On-going     PT SHORT TERM GOAL #4   Title Patient will be independent with initial HEP for knee and right wrist/elbow   Time 3   Period Weeks   Status Achieved           PT Long Term Goals - 10/11/15 1358      PT LONG TERM GOAL #1   Title Patient will return to full work  out program without pain in elbow   Time 6   Period Weeks   Status New     PT LONG TERM GOAL #2   Title Patient will use his right upper extremity for daily tasks without pain   Time 6   Period Weeks   Status On-going     PT LONG TERM GOAL #3   Title Patient will demsotrate an 80lb grip on the left in order to carry items without pain   Time 6   Period Weeks   Status On-going     PT LONG TERM GOAL #4   Title Pt will be independent with advanced HEP  for knee   Time 6   Period Weeks   Status New     PT LONG TERM GOAL #5   Title Pt will be able to demonstrate cutting manuevers without illicting pain in right knee to return to active lifestyle   Time 6   Period Weeks   Status New               Plan - 10/16/15 1213    Clinical Impression Statement Pt returns to clinic with 1/10 pain in right elbow and right knee.  Pt added to HEP for knee / hip strength and reports he did not do exericises for knee but did get a flex bar for right tennis elbow.  Pt with minimal pain in right elbow but does feel it after working at computer.  Pt is ready to progress for stretngth of knee and elbow next visit .Marland Kitchen Pt achieved STG for independence with initial HEP for UE and LE   Rehab Potential Excellent   PT Frequency 2x / week   PT Duration 6 weeks   PT Treatment/Interventions ADLs/Self Care Home Management;Cryotherapy;Electrical Stimulation;Iontophoresis 4mg /ml Dexamethasone;Moist Heat;Ultrasound;Gait training;Stair training;Functional mobility training;Patient/family education;Neuromuscular re-education;Therapeutic exercise;Therapeutic activities;Passive range of motion;Manual techniques;Dry needling;Taping;Splinting   PT Next Visit Plan review exericises for knee and right UE and progress as needed. Pt would like to be able to perform cutting manueuvers with right knee Assess goals and grip strengtht   PT Home Exercise Plan Level 1 knee exercise.  Eccentric flex bar exericises for common  wrist extensors and added hip strength this visit and 5 lb wts added to knee   Consulted and Agree with Plan of Care Patient      Patient will benefit from skilled therapeutic intervention in order to improve the following deficits and impairments:  Pain, Decreased strength, Decreased range of motion  Visit Diagnosis: Pain in right elbow  Muscle weakness (generalized)  Stiffness of right elbow, not elsewhere classified  Pain in right knee     Problem List There are no active problems to display for this patient.   Voncille Lo, PT 10/16/15 12:18 PM Phone: [phone] Fax: [place] [facility] [address] [place], [place], [address] Phone: [phone]   Fax:  [phone]  Name: TREYVOR MARSALA MRN: [id number] Date of Birth: 09-30-1955

## 2015-10-23 ENCOUNTER — Ambulatory Visit: Payer: 59 | Admitting: Physical Therapy

## 2015-10-24 MED FILL — HYDROCHLOROTHIAZIDE 12.5 MG: 12.5 | 90 days supply | Qty: 90 | Fill #2

## 2015-10-25 ENCOUNTER — Ambulatory Visit: Payer: 59 | Admitting: Physical Therapy

## 2015-10-30 ENCOUNTER — Ambulatory Visit: Payer: 59 | Admitting: Physical Therapy

## 2015-10-30 DIAGNOSIS — M25521 Pain in right elbow: Secondary | ICD-10-CM

## 2015-10-30 DIAGNOSIS — M6281 Muscle weakness (generalized): Secondary | ICD-10-CM | POA: Diagnosis not present

## 2015-10-30 DIAGNOSIS — M25561 Pain in right knee: Secondary | ICD-10-CM | POA: Diagnosis not present

## 2015-10-30 DIAGNOSIS — M25621 Stiffness of right elbow, not elsewhere classified: Secondary | ICD-10-CM

## 2015-10-31 NOTE — Therapy (Signed)
[place], [place], [address] Phone: [phone]   Fax:  [phone]  Physical Therapy Treatment  Patient Details  Name: Chris Russell MRN: [id number] Date of Birth: [birth year] Referring Provider: Dr. Berle Mull  Encounter Date: 10/30/2015      PT End of Session - 10/30/15 2205    Visit Number 5   Number of Visits 15   Date for PT Re-Evaluation 11/22/15   Authorization Type [facility]    PT Start Time 1100   PT Stop Time 1140   PT Time Calculation (min) 40 min   Activity Tolerance Patient tolerated treatment well   Behavior During Therapy [facility] for tasks assessed/performed      Past Medical History:  Diagnosis Date  . Right knee pain     No past surgical history on file.  There were no vitals filed for this visit.      Subjective Assessment - 10/30/15 2148    Subjective Patient reports he has not done all his knee exercises but he has done some. He continues to have minor pain in his elbow but he feels like it is improving. He is doing his exercises with a 3 pound weight.    Pertinent History Pat history of lateral epicondylitis    Limitations Other (comment)   How long can you sit comfortably? N/A    How long can you stand comfortably? 8 hours but I feel it at end of day   How long can you walk comfortably? N/A    Diagnostic tests X-ray: (-) per patient    Patient Stated Goals Pain free and an exercise program    Currently in Pain? Yes   Pain Score 1    Pain Location Elbow   Pain Orientation Right   Pain Descriptors / Indicators Aching   Pain Type Acute pain   Pain Onset More than a month ago   Pain Frequency Intermittent   Aggravating Factors  use of the right elbow    Pain Relieving Factors rest   Effect of Pain on Daily Activities difficulty using his right arm    Multiple Pain Sites Yes   Pain Score 3   Pain Location Knee   Pain Orientation Right   Pain Descriptors /  Indicators Aching   Pain Type Acute pain   Pain Onset More than a month ago   Pain Frequency Intermittent   Aggravating Factors  moving in certain directions    Pain Relieving Factors rest and ice    Effect of Pain on Daily Activities difficulty ambulationg                          [facility] Adult PT Treatment/Exercise - 10/30/15 0001      Knee/Hip Exercises: Machines for Strengthening   Cybex Knee Extension 15lb eccentric lowering 2x10      Knee/Hip Exercises: Standing   Other Standing Knee Exercises reviewed singlew leg stance 3x20sec; single leg with door tocuh 2x10 with increasing height conde drill 2x10; ;lateral band walks 2x10     Wrist Exercises   Other wrist exercises wrist flexion 2x10 5lb wrist extension 2x10 5lb; supination/ pronation 5lb 2x10;       Manual Therapy   Manual Therapy Soft tissue mobilization   Soft tissue mobilization IASTYM to MCL area to improve blood flow and improve healing  PT Short Term Goals - 10/31/15 0740      PT SHORT TERM GOAL #1   Title Patient will demsotrate full pain free active ROM of the right upper extremity   Baseline 1/10   Time 3   Period Weeks   Status On-going     PT SHORT TERM GOAL #2   Title Patient will demsotrate 5/5 right upper extremity strength    Time 3   Period Weeks   Status On-going     PT SHORT TERM GOAL #3   Title Patient will increase grip strength on the right to 70 lbs    Baseline to be taken next visit   Period Weeks   Status On-going     PT SHORT TERM GOAL #4   Title Patient will be independent with initial HEP for knee and right wrist/elbow   Time 3   Period Weeks   Status Achieved           PT Long Term Goals - 10/11/15 1358      PT LONG TERM GOAL #1   Title Patient will return to full work out program without pain in elbow   Time 6   Period Weeks   Status New     PT LONG TERM GOAL #2   Title Patient will use his right upper extremity for  daily tasks without pain   Time 6   Period Weeks   Status On-going     PT LONG TERM GOAL #3   Title Patient will demsotrate an 80lb grip on the left in order to carry items without pain   Time 6   Period Weeks   Status On-going     PT LONG TERM GOAL #4   Title Pt will be independent with advanced HEP  for knee   Time 6   Period Weeks   Status New     PT LONG TERM GOAL #5   Title Pt will be able to demonstrate cutting manuevers without illicting pain in right knee to return to active lifestyle   Time 6   Period Weeks   Status New               Plan - 10/31/15 0741    Clinical Impression Statement Patient feels comofrtable working on his elbow on his now. He performed exercises with a 5 lb weight without significant pain. Thereapy focusefd on his knee. He was given single leg stability exercises to reduce the stress on the MCL. Therapy perfromed IASTYM to improve healing of the MCL. He will perform his exercises for a week then return to therapy.     Rehab Potential Excellent   PT Frequency 2x / week   PT Duration 6 weeks   PT Treatment/Interventions ADLs/Self Care Home Management;Cryotherapy;Electrical Stimulation;Iontophoresis 4mg /ml Dexamethasone;Moist Heat;Ultrasound;Gait training;Stair training;Functional mobility training;Patient/family education;Neuromuscular re-education;Therapeutic exercise;Therapeutic activities;Passive range of motion;Manual techniques;Dry needling;Taping;Splinting   PT Next Visit Plan review exericises for knee and right UE and progress as needed. Pt would like to be able to perform cutting manueuvers with right knee Assess goals and grip strengtht   PT Home Exercise Plan Level 1 knee exercise.  Eccentric flex bar exericises for common wrist extensors and added hip strength this visit and 5 lb wts added to knee   Consulted and Agree with Plan of Care Patient      Patient will benefit from skilled therapeutic intervention in order to improve the  following deficits and impairments:  Pain, Decreased strength, Decreased  range of motion  Visit Diagnosis: Pain in right elbow  Muscle weakness (generalized)  Pain in right knee  Stiffness of right elbow, not elsewhere classified     Problem List There are no active problems to display for this patient.   Carney Living  PT DPT  10/31/2015, 7:45 AM  [facility] [address] [place], [place], [address] Phone: [phone]   Fax:  [phone]  Name: NYHEEM MADURO MRN: [id number] Date of Birth: September 02, 1955

## 2015-11-01 ENCOUNTER — Ambulatory Visit: Payer: 59 | Admitting: Physical Therapy

## 2015-11-06 ENCOUNTER — Ambulatory Visit: Payer: 59 | Attending: Sports Medicine | Admitting: Physical Therapy

## 2015-11-06 DIAGNOSIS — M25621 Stiffness of right elbow, not elsewhere classified: Secondary | ICD-10-CM | POA: Insufficient documentation

## 2015-11-06 DIAGNOSIS — M6281 Muscle weakness (generalized): Secondary | ICD-10-CM | POA: Insufficient documentation

## 2015-11-06 DIAGNOSIS — M25561 Pain in right knee: Secondary | ICD-10-CM | POA: Diagnosis present

## 2015-11-06 DIAGNOSIS — M25521 Pain in right elbow: Secondary | ICD-10-CM | POA: Diagnosis not present

## 2015-11-06 NOTE — Therapy (Addendum)
[place], [place], [address] Phone: [phone]   Fax:  [phone]  Physical Therapy Treatment  Patient Details  Name: Chris Russell MRN: [id number] Date of Birth: Oct 02, 1955 Referring Provider: Dr. Berle Mull  Encounter Date: 11/06/2015      PT End of Session - 11/06/15 1425    Visit Number 6   Number of Visits 15   Date for PT Re-Evaluation 11/22/15   Authorization Type [facility]    PT Start Time 1330   PT Stop Time 1410   PT Time Calculation (min) 40 min   Activity Tolerance Patient tolerated treatment well   Behavior During Therapy [facility] for tasks assessed/performed      Past Medical History:  Diagnosis Date  . Right knee pain     No past surgical history on file.  There were no vitals filed for this visit.      Subjective Assessment - 11/06/15 1413    Subjective Patient has no pain in his elbow and only minor pain in his knee. he was able to push off the wall when swiming without any pain.    Pertinent History Pat history of lateral epicondylitis    Limitations Other (comment)   How long can you sit comfortably? N/A    How long can you stand comfortably? 8 hours but I feel it at end of day   How long can you walk comfortably? N/A    Diagnostic tests X-ray: (-) per patient    Patient Stated Goals Pain free and an exercise program    Currently in Pain? No/denies  Pain at times but not today                         [facility] Adult PT Treatment/Exercise - 11/06/15 0001      Knee/Hip Exercises: Standing   Lateral Step Up Limitations 20x 8 inch step    Forward Step Up Limitations 20x 8 inch step with hold    Step Down Limitations eccentric 6" 20x    Other Standing Knee Exercises latereal band walk green x20; monster walk x20 green      Knee/Hip Exercises: Supine   Other Supine Knee/Hip Exercises bridging with 5 lb wt x 15      Manual Therapy   Manual Therapy Soft  tissue mobilization   Soft tissue mobilization IASTYM to MCL area to improve blood flow and improve healing                 PT Education - 11/06/15 1425    Education provided Yes   Education Details continue to work on stabilization activity.    Person(s) Educated Patient   Methods Explanation;Demonstration;Tactile cues   Comprehension Verbalized understanding;Returned demonstration          PT Short Term Goals - 11/06/15 1530      PT SHORT TERM GOAL #1   Title Patient will demsotrate full pain free active ROM of the right upper extremity   Baseline No pain    Time 3   Period Weeks   Status Achieved     PT SHORT TERM GOAL #2   Title Patient will demsotrate 5/5 right upper extremity strength    Baseline 5/5 gross wrist elbow and shoulder strength   Time 3   Period Weeks   Status On-going     PT SHORT TERM GOAL #3   Title Patient will increase grip strength on  the right to 70 lbs    Baseline to be taken next visit   Time 3   Period Weeks   Status On-going     PT SHORT TERM GOAL #4   Title Patient will be independent with initial HEP for knee and right wrist/elbow   Time 3   Period Weeks   Status Achieved           PT Long Term Goals - 10/11/15 1358      PT LONG TERM GOAL #1   Title Patient will return to full work out program without pain in elbow   Time 6   Period Weeks   Status New     PT LONG TERM GOAL #2   Title Patient will use his right upper extremity for daily tasks without pain   Time 6   Period Weeks   Status On-going     PT LONG TERM GOAL #3   Title Patient will demsotrate an 80lb grip on the left in order to carry items without pain   Time 6   Period Weeks   Status On-going     PT LONG TERM GOAL #4   Title Pt will be independent with advanced HEP  for knee   Time 6   Period Weeks   Status New     PT LONG TERM GOAL #5   Title Pt will be able to demonstrate cutting manuevers without illicting pain in right knee to return to  active lifestyle   Time 6   Period Weeks   Status New               Plan - 11/06/15 1524    Clinical Impression Statement Patient continues to make good progress. He has very little pain in his elbows and knees. He feels comfortable with his HEP. He feels like he will be able to continue with his exercises on his on. He will try over the next few weeks. Likely D/C next visit.    Rehab Potential Excellent   PT Frequency 2x / week   PT Duration 6 weeks   PT Treatment/Interventions ADLs/Self Care Home Management;Cryotherapy;Electrical Stimulation;Iontophoresis 110m/ml Dexamethasone;Moist Heat;Ultrasound;Gait training;Stair training;Functional mobility training;Patient/family education;Neuromuscular re-education;Therapeutic exercise;Therapeutic activities;Passive range of motion;Manual techniques;Dry needling;Taping;Splinting   PT Next Visit Plan review exericises for knee and right UE and progress as needed. Pt would like to be able to perform cutting manueuvers with right knee Assess goals and grip strengtht   PT Home Exercise Plan Level 1 knee exercise.  Eccentric flex bar exericises for common wrist extensors and added hip strength this visit and 5 lb wts added to knee   Consulted and Agree with Plan of Care Patient      Patient will benefit from skilled therapeutic intervention in order to improve the following deficits and impairments:  Pain, Decreased strength, Decreased range of motion  Visit Diagnosis: Pain in right elbow  Muscle weakness (generalized)  Acute pain of right knee  Stiffness of right elbow, not elsewhere classified  PHYSICAL THERAPY DISCHARGE SUMMARY  Visits from Start of Care: 6  Current functional level related to goals / functional outcomes: Did not return for last visit    Remaining deficits: Unknown    Education / Equipment: Unknown  Plan: Patient agrees to discharge.  Patient goals were not met. Patient is being discharged due to meeting the  stated rehab goals.  ?????       Problem List There are no active problems to display  for this patient.   Carney Living  PT DPT  11/06/2015, 3:33 PM  [facility] [address] [place], [place], [address] Phone: [phone]   Fax:  [phone]  Name: Chris Russell MRN: [id number] Date of Birth: 07/25/1955

## 2015-11-23 MED FILL — AMLODIPINE BESYLATE 5 MG TA: 5 | 90 days supply | Qty: 90 | Fill #2

## 2015-12-17 DIAGNOSIS — L821 Other seborrheic keratosis: Secondary | ICD-10-CM | POA: Diagnosis not present

## 2015-12-17 DIAGNOSIS — B360 Pityriasis versicolor: Secondary | ICD-10-CM | POA: Diagnosis not present

## 2015-12-17 DIAGNOSIS — D239 Other benign neoplasm of skin, unspecified: Secondary | ICD-10-CM | POA: Diagnosis not present

## 2015-12-19 MED FILL — VALSARTAN 160 MG TABLET: 160 | 90 days supply | Qty: 90 | Fill #2

## 2016-01-14 MED FILL — HYDROCHLOROTHIAZIDE 12.5 MG: 12.5 | 90 days supply | Qty: 90 | Fill #3

## 2016-02-14 MED FILL — AMLODIPINE BESYLATE 5 MG TA: 5 | 90 days supply | Qty: 90 | Fill #0

## 2016-03-13 MED FILL — VALSARTAN 160 MG TABLET: 160 | 90 days supply | Qty: 90 | Fill #3

## 2016-04-14 MED FILL — HYDROCHLOROTHIAZIDE 12.5 MG: 12.5 | 90 days supply | Qty: 90 | Fill #0

## 2016-05-19 MED FILL — AMLODIPINE BESYLATE 5 MG TA: 5 | 90 days supply | Qty: 90 | Fill #1

## 2016-06-10 DIAGNOSIS — L568 Other specified acute skin changes due to ultraviolet radiation: Secondary | ICD-10-CM | POA: Diagnosis not present

## 2016-06-10 DIAGNOSIS — L923 Foreign body granuloma of the skin and subcutaneous tissue: Secondary | ICD-10-CM | POA: Diagnosis not present

## 2016-06-10 DIAGNOSIS — D229 Melanocytic nevi, unspecified: Secondary | ICD-10-CM | POA: Diagnosis not present

## 2016-06-16 DIAGNOSIS — H5213 Myopia, bilateral: Secondary | ICD-10-CM | POA: Diagnosis not present

## 2016-06-16 DIAGNOSIS — H524 Presbyopia: Secondary | ICD-10-CM | POA: Diagnosis not present

## 2016-06-16 DIAGNOSIS — H52223 Regular astigmatism, bilateral: Secondary | ICD-10-CM | POA: Diagnosis not present

## 2016-06-17 MED FILL — VALSARTAN 160 MG TABLET: 160 | 90 days supply | Qty: 90 | Fill #0

## 2016-07-11 MED FILL — HYDROCHLOROTHIAZIDE 12.5 MG: 12.5 | 90 days supply | Qty: 90 | Fill #0

## 2016-07-16 DIAGNOSIS — Z79899 Other long term (current) drug therapy: Secondary | ICD-10-CM | POA: Diagnosis not present

## 2016-07-16 DIAGNOSIS — B360 Pityriasis versicolor: Secondary | ICD-10-CM | POA: Diagnosis not present

## 2016-07-16 DIAGNOSIS — Z0000 Encounter for general adult medical examination without abnormal findings: Secondary | ICD-10-CM | POA: Diagnosis not present

## 2016-07-16 DIAGNOSIS — G4700 Insomnia, unspecified: Secondary | ICD-10-CM | POA: Diagnosis not present

## 2016-07-16 DIAGNOSIS — Z91030 Bee allergy status: Secondary | ICD-10-CM | POA: Diagnosis not present

## 2016-07-16 DIAGNOSIS — I10 Essential (primary) hypertension: Secondary | ICD-10-CM | POA: Diagnosis not present

## 2016-07-16 DIAGNOSIS — E782 Mixed hyperlipidemia: Secondary | ICD-10-CM | POA: Diagnosis not present

## 2016-07-16 DIAGNOSIS — Z125 Encounter for screening for malignant neoplasm of prostate: Secondary | ICD-10-CM | POA: Diagnosis not present

## 2016-07-16 DIAGNOSIS — E559 Vitamin D deficiency, unspecified: Secondary | ICD-10-CM | POA: Diagnosis not present

## 2016-07-16 DIAGNOSIS — N529 Male erectile dysfunction, unspecified: Secondary | ICD-10-CM | POA: Diagnosis not present

## 2016-07-16 MED FILL — ZOLPIDEM TARTRATE 5 MG TAB: 5 | 30 days supply | Qty: 30 | Fill #0

## 2016-07-30 DIAGNOSIS — Z1211 Encounter for screening for malignant neoplasm of colon: Secondary | ICD-10-CM | POA: Diagnosis not present

## 2016-07-30 DIAGNOSIS — Z1212 Encounter for screening for malignant neoplasm of rectum: Secondary | ICD-10-CM | POA: Diagnosis not present

## 2016-08-14 MED FILL — AMLODIPINE BESYLATE 5 MG TA: 5 | 90 days supply | Qty: 90 | Fill #0

## 2016-09-02 MED FILL — VALSARTAN-HCTZ 320-12.5 MG: 320-12.5 | 90 days supply | Qty: 45 | Fill #0

## 2016-11-11 MED FILL — ZOLPIDEM TARTRATE 5 MG TAB: 5 | 30 days supply | Qty: 30 | Fill #1

## 2016-11-11 MED FILL — AMLODIPINE BESYLATE 5 MG TA: 5 | 90 days supply | Qty: 90 | Fill #1

## 2016-11-13 MED FILL — VALSARTAN-HCTZ 320-12.5 MG: 320-12.5 | 90 days supply | Qty: 45 | Fill #1

## 2016-12-10 DIAGNOSIS — M7542 Impingement syndrome of left shoulder: Secondary | ICD-10-CM | POA: Diagnosis not present

## 2016-12-18 ENCOUNTER — Ambulatory Visit: Payer: 59 | Attending: Orthopedic Surgery | Admitting: Physical Therapy

## 2016-12-18 ENCOUNTER — Encounter: Payer: Self-pay | Admitting: Physical Therapy

## 2016-12-18 DIAGNOSIS — M6281 Muscle weakness (generalized): Secondary | ICD-10-CM | POA: Diagnosis present

## 2016-12-18 DIAGNOSIS — M25612 Stiffness of left shoulder, not elsewhere classified: Secondary | ICD-10-CM | POA: Insufficient documentation

## 2016-12-18 DIAGNOSIS — M25512 Pain in left shoulder: Secondary | ICD-10-CM | POA: Diagnosis not present

## 2016-12-18 DIAGNOSIS — R293 Abnormal posture: Secondary | ICD-10-CM | POA: Diagnosis present

## 2016-12-18 NOTE — Patient Instructions (Signed)
   Trigger Point Dry Needling  . What is Trigger Point Dry Needling (DN)? o DN is a physical therapy technique used to treat muscle pain and dysfunction. Specifically, DN helps deactivate muscle trigger points (muscle knots).  o A thin filiform needle is used to penetrate the skin and stimulate the underlying trigger point. The goal is for a local twitch response (LTR) to occur and for the trigger point to relax. No medication of any kind is injected during the procedure.   . What Does Trigger Point Dry Needling Feel Like?  o The procedure feels different for each individual patient. Some patients report that they do not actually feel the needle enter the skin and overall the process is not painful. Very mild bleeding may occur. However, many patients feel a deep cramping in the muscle in which the needle was inserted. This is the local twitch response.   Marland Kitchen How Will I feel after the treatment? o Soreness is normal, and the onset of soreness may not occur for a few hours. Typically this soreness does not last longer than two days.  o Bruising is uncommon, however; ice can be used to decrease any possible bruising.  o In rare cases feeling tired or nauseous after the treatment is normal. In addition, your symptoms may get worse before they get better, this period will typically not last longer than 24 hours.   . What Can I do After My Treatment? o Increase your hydration by drinking more water for the next 24 hours. o You may place ice or heat on the areas treated that have become sore, however, do not use heat on inflamed or bruised areas. Heat often brings more relief post needling. o You can continue your regular activities, but vigorous activity is not recommended initially after the treatment for 24 hours. o DN is best combined with other physical therapy such as strengthening, stretching, and other therapies.   MOST important exercise.  ISOMETRIC EXTENSION>  Place left wrist into wall with  goo posture, chin down , chest lifted up, ribs lifted up to sky.  HOLD for 5 sec and feel inferior angle of scapula on left go towards back pocket.  Do 10 times  3 x a day.  Voncille Lo, PT Certified Exercise Expert for the Aging Adult  12/18/16 9:26 AM Phone: [phone] Fax: [phone]

## 2016-12-18 NOTE — Therapy (Addendum)
[place], [place], [address] Phone: [phone]   Fax:  [phone]  Physical Therapy Evaluation/Discharge Note  Patient Details  Name: Chris Russell MRN: [id number] Date of Birth: 1955-05-29 Referring Provider: Justice Britain MD   Encounter Date: 12/18/2016  PT End of Session - 12/18/16 1245    Visit Number  1    Number of Visits  12    Date for PT Re-Evaluation  01/29/17    Authorization Type  [facility]    PT Start Time  0846    PT Stop Time  0944    PT Time Calculation (min)  58 min    Activity Tolerance  Patient tolerated treatment well    Behavior During Therapy  [facility] for tasks assessed/performed       Past Medical History:  Diagnosis Date  . Right knee pain     History reviewed. No pertinent surgical history.  There were no vitals filed for this visit.   Subjective Assessment - 12/18/16 0858    Subjective  No incident.  As I was swimming and I started noticing pain in my left shoulder.  I decided to see Dr Onnie Graham and try to treat this early  I noticing it most when i am swimming, freestyle.      Limitations  Lifting swimming    How long can you sit comfortably?  unlimited    How long can you stand comfortably?  unlimited    How long can you walk comfortably?  unlimited    Diagnostic tests  x ray  , nothing on x ray , normal    Patient Stated Goals  Getting exercises and not hurt when I swim    Currently in Pain?  Yes    Pain Score  3  0/10    Pain Orientation  Left    Pain Descriptors / Indicators  Discomfort;Dull    Pain Onset  More than a month ago last 2 months and did not go away    Pain Frequency  Intermittent    Aggravating Factors   swimming , over extending arm in front of shoulder    Pain Relieving Factors  nothing         [facility] PT Assessment - 12/18/16 0903      Assessment   Medical Diagnosis  Left shoulder pain, impingemtn    Referring Provider  Justice Britain MD    Onset Date/Surgical Date  -- 2-3 months    Hand Dominance  Right    Next MD Visit  1 month    Prior Therapy  yes but not for left shoulder      Precautions   Precautions  None      Restrictions   Weight Bearing Restrictions  No      Balance Screen   Has the patient fallen in the past 6 months  No    Has the patient had a decrease in activity level because of a fear of falling?   No    Is the patient reluctant to leave their home because of a fear of falling?   No      Home Environment   Living Environment  Private residence    Living Arrangements  Spouse/significant other    Type of [place] to enter    Entrance Stairs-Number of Steps  Sun  Right    Home  Layout  Multi-level      Prior Function   Level of Independence  Independent      Cognition   Overall Cognitive Status  Within Functional Limits for tasks assessed      Observation/Other Assessments   Focus on Therapeutic Outcomes (FOTO)   FOTO Intake 67%. limitation 33%  predicted 23%      Sensation   Light Touch  Appears Intact      Posture/Postural Control   Posture/Postural Control  Postural limitations    Postural Limitations  Rounded Shoulders;Forward head    Posture Comments  left anterior tilted scapular, lower trap and serratus weakness on left      ROM / Strength   AROM / PROM / Strength  AROM;Strength      AROM   Overall AROM   Deficits    Right Shoulder Extension  38 Degrees    Right Shoulder Flexion  160 Degrees    Right Shoulder ABduction  158 Degrees    Right Shoulder Internal Rotation  65 Degrees    Right Shoulder External Rotation  95 Degrees    Right Shoulder Horizontal ABduction  120 Degrees    Right Shoulder Horizontal  ADduction  40 Degrees    Left Shoulder Extension  29 Degrees    Left Shoulder Flexion  150 Degrees    Left Shoulder ABduction  147 Degrees    Left Shoulder Internal Rotation  70 Degrees    Left Shoulder External Rotation  78  Degrees    Left Shoulder Horizontal ABduction  25 Degrees pain    Left Shoulder Horizontal ADduction  110 Degrees pain down lateral shoulder      Strength   Overall Strength  Deficits    Right Shoulder Flexion  5/5    Right Shoulder Extension  5/5    Right Shoulder ABduction  5/5    Right Shoulder Internal Rotation  5/5    Right Shoulder External Rotation  5/5    Left Shoulder Flexion  5/5    Left Shoulder Extension  5/5    Left Shoulder ABduction  5/5    Left Shoulder Internal Rotation  5/5    Left Shoulder External Rotation  4+/5      Palpation   Palpation comment  tenderness over medial deltoid and along suprspinatus referral pattern  , tightend left pectoral muscles             Objective measurements completed on examination: See above findings.      [facility] Adult PT Treatment/Exercise - 12/18/16 0903      Self-Care   Self-Care  Other Self-Care Comments    Other Self-Care Comments   education on TPDN after care and precautians with handout      Shoulder Exercises: Standing   External Rotation  Strengthening;15 reps;Both;Theraband shown how to do unilateral as well but using bothtowel roll    Theraband Level (Shoulder External Rotation)  Level 3 (Green)    External Rotation Limitations  x2    Extension  Strengthening;Both;15 reps    Theraband Level (Shoulder Extension)  Level 3 (Green)    Extension Limitations  Science writer;Theraband;15 reps    Theraband Level (Shoulder Row)  Level 3 (Green)    Row Limitations  x1    Other Standing Exercises  Isometric Extension with left against wall x 10 with VC and TC for depression of inferior angle of scapular      Modalities   Modalities  Moist  Heat      Moist Heat Therapy   Number Minutes Moist Heat  15 Minutes    Moist Heat Location  Shoulder left and pectoral muscles      Manual Therapy   Manual Therapy  Joint mobilization;Soft tissue mobilization    Joint Mobilization  inferior and posterior glide  grade 4 left shoulder    Soft tissue mobilization  left periscapular and pectoral and supraspinatus        Trigger Point Dry Needling - 12/18/16 1005    Consent Given?  Yes    Education Handout Provided  Yes    Muscles Treated Upper Body  Upper trapezius;Pectoralis major;Pectoralis minor;Supraspinatus deltoid all TDN left only    Upper Trapezius Response  Twitch reponse elicited;Palpable increased muscle length    Pectoralis Major Response  Twitch response elicited;Palpable increased muscle length    Pectoralis Minor Response  Twitch response elicited;Palpable increased muscle length    Supraspinatus Response  Twitch response elicited;Palpable increased muscle length           PT Education - 12/18/16 0924    Education provided  Yes    Education Details  POC, Explanation of findings. intial RTC exercises,  dry needling education and aftercare and precautians    Person(s) Educated  Patient    Methods  Explanation;Demonstration;Tactile cues;Verbal cues;Handout    Comprehension  Verbalized understanding;Returned demonstration       PT Short Term Goals - 12/18/16 1012      PT SHORT TERM GOAL #1   Title  STG=LTG        PT Long Term Goals - 12/18/16 1012      PT LONG TERM GOAL #1   Title  Pt will return to swimming activities without exacerbation of pain in left shoulder with overhead strokes    Time  6    Period  Weeks    Status  New      PT LONG TERM GOAL #2   Title  Pt will have 160 scaption without pain in left shoulder in order to participate in leisure activities/ work    Time  6    Period  Weeks    Status  New      PT LONG TERM GOAL #3   Title  Pt will be independent with advanced HEP for shoulder strength/ scapular stabilizers    Time  6    Period  Weeks    Status  New      PT LONG TERM GOAL #4   Title  "FOTO will improve from  33% limitation  to  23 % limitation   indicating improved functional use of left shoulder    Time  6    Period  Weeks    Status   New      PT LONG TERM GOAL #5   Title  "Demonstrate understanding of proper sitting posture, body mechanics, work ergonomics, and be more conscious of position and posture throughout the day.     Time  6    Period  Weeks    Status  New      PT LONG TERM GOAL #6   Title  left shoulder will return to 5/5 strength in left ER    Baseline  4+/5 today    Time  6    Period  Weeks    Status  New             Plan - 12/18/16 1245    Clinical Impression  Statement   61 yo male presents with signs and symptoms compatible with impingement with mild rotator cuff tendinitis. Pt presents with impairments including pain, limited ROM, weakness, and as a result, pt is limited with ADLs and functional activities especially regarding his exercise and swimming program.  Pt would benefit from skilled outpatient PT services for 2 times a week for 6 weeks to progress toward pain-free PLOF.     Clinical Presentation  Stable    Clinical Decision Making  Low    Rehab Potential  Excellent    PT Frequency  2x / week    PT Duration  6 weeks    PT Treatment/Interventions  Cryotherapy;Electrical Stimulation;Iontophoresis 68m/ml Dexamethasone;Moist Heat;Ultrasound;Neuromuscular re-education;Therapeutic exercise;Therapeutic activities;Patient/family education;Manual techniques;Taping;Dry needling;Passive range of motion    PT Next Visit Plan  Assess Dry needlling.   Pectoral stretches, and use wts for sidelying left ER     PT Home Exercise Plan  Pt with standing scapular stabilizers with green t band, standing isometric extension.     Consulted and Agree with Plan of Care  Patient       Patient will benefit from skilled therapeutic intervention in order to improve the following deficits and impairments:  Pain, Impaired UE functional use, Postural dysfunction, Decreased strength, Decreased range of motion  Visit Diagnosis: Left shoulder pain, unspecified chronicity  Abnormal posture  Muscle weakness  (generalized)  Stiffness of left shoulder, not elsewhere classified     Problem List There are no active problems to display for this patient.   LVoncille Lo PT Certified Exercise Expert for the Aging Adult  12/18/16 12:51 PM Phone: [phone]: [phone] By signing I understand that I am ordering/authorizing the use of Iontophoresis using 4 mg/mL of dexamethasone as a component of this plan of care. [place] [address]Phone: [phone]  Fax:  [phone] Name: Chris WARWICKMRN: [id number] of Birth: [birth year]  PHYSICAL THERAPY DISCHARGE SUMMARY  Visits from Start of Care: 1  Current functional level related to goals / functional outcomes: Last evaluated, pt was called to check on making additional appt. And pt has had a retinal tear and wishes to DC at this time.  He states he is doing better with his shoulder but no known data and pt not present to assess    Remaining deficits: As above, not assessed currently   Education / Equipment: TPDN and HEP 1 visit Plan: Patient agrees to discharge.  Patient goals were not met. Patient is being discharged due to a change in medical status.  ????? pt had retinal tear   and pt request for DAshley PT Certified Exercise Expert for the Aging Adult  01/08/17 9:13 AM Phone: [phone]: [phone]

## 2016-12-23 ENCOUNTER — Encounter: Payer: 59 | Admitting: Physical Therapy

## 2016-12-23 DIAGNOSIS — H33311 Horseshoe tear of retina without detachment, right eye: Secondary | ICD-10-CM | POA: Diagnosis not present

## 2016-12-23 DIAGNOSIS — H35413 Lattice degeneration of retina, bilateral: Secondary | ICD-10-CM | POA: Diagnosis not present

## 2016-12-24 DIAGNOSIS — H33311 Horseshoe tear of retina without detachment, right eye: Secondary | ICD-10-CM | POA: Diagnosis not present

## 2016-12-30 MED FILL — ALPRAZolam 0.25 MG TABS: 0.25 | 1 days supply | Qty: 2 | Fill #0

## 2016-12-31 DIAGNOSIS — H35413 Lattice degeneration of retina, bilateral: Secondary | ICD-10-CM | POA: Diagnosis not present

## 2017-01-07 DIAGNOSIS — H33311 Horseshoe tear of retina without detachment, right eye: Secondary | ICD-10-CM | POA: Diagnosis not present

## 2017-01-08 ENCOUNTER — Telehealth: Payer: Self-pay | Admitting: Physical Therapy

## 2017-01-08 NOTE — Telephone Encounter (Signed)
Pt was called to check on making additional appt.  Pt states he is doing well and that he was unable to come in due to a recent retinal tear.  He requests to be DC at this time.   Voncille Lo, PT Certified Exercise Expert for the Aging Adult  01/08/17 9:17 AM Phone: [phone] Fax: [phone]

## 2017-01-30 ENCOUNTER — Other Ambulatory Visit: Payer: Self-pay | Admitting: Orthopedic Surgery

## 2017-01-30 DIAGNOSIS — M7542 Impingement syndrome of left shoulder: Secondary | ICD-10-CM

## 2017-02-02 ENCOUNTER — Ambulatory Visit
Admission: RE | Admit: 2017-02-02 | Discharge: 2017-02-02 | Disposition: A | Discharge status: Home / Self Care | Payer: 59 | Source: Ambulatory Visit | Attending: Orthopedic Surgery | Admitting: Orthopedic Surgery

## 2017-02-02 DIAGNOSIS — M7542 Impingement syndrome of left shoulder: Secondary | ICD-10-CM

## 2017-02-02 DIAGNOSIS — M19012 Primary osteoarthritis, left shoulder: Secondary | ICD-10-CM | POA: Diagnosis not present

## 2017-02-04 DIAGNOSIS — H33311 Horseshoe tear of retina without detachment, right eye: Secondary | ICD-10-CM | POA: Diagnosis not present

## 2017-02-10 MED FILL — VALSARTAN-HCTZ 320-12.5 MG: 320-12.5 | 90 days supply | Qty: 45 | Fill #2

## 2017-02-10 MED FILL — AMLODIPINE BESYLATE 5 MG TA: 5 | 90 days supply | Qty: 90 | Fill #2

## 2017-02-16 ENCOUNTER — Ambulatory Visit: Payer: 59 | Attending: Orthopedic Surgery

## 2017-02-16 DIAGNOSIS — M25612 Stiffness of left shoulder, not elsewhere classified: Secondary | ICD-10-CM | POA: Diagnosis present

## 2017-02-16 DIAGNOSIS — R293 Abnormal posture: Secondary | ICD-10-CM | POA: Insufficient documentation

## 2017-02-16 DIAGNOSIS — M6281 Muscle weakness (generalized): Secondary | ICD-10-CM | POA: Diagnosis present

## 2017-02-16 DIAGNOSIS — M25512 Pain in left shoulder: Secondary | ICD-10-CM | POA: Insufficient documentation

## 2017-02-16 NOTE — Patient Instructions (Signed)
Issued form cabinet behind back stretch 2-3x/day 30 sec and prone ITY 1x/dat 10-20 reps and Pilates breast stroke prep and swan with head and chest lift 10-20 reps 1x/day

## 2017-02-16 NOTE — Therapy (Signed)
[place], [place], [address] Phone: [phone]   Fax:  [phone]  Physical Therapy Evaluation  Patient Details  Name: Chris Russell MRN: [id number] Date of Birth: 10-03-1955 Referring Provider: Justice Britain, MD   Encounter Date: 02/16/2017  PT End of Session - 02/16/17 0922    Visit Number  1    Number of Visits  12    Date for PT Re-Evaluation  03/27/17    Authorization Type  UMR    PT Start Time  0925    PT Stop Time  1015    PT Time Calculation (min)  50 min    Activity Tolerance  Patient tolerated treatment well    Behavior During Therapy  [facility] for tasks assessed/performed       Past Medical History:  Diagnosis Date  . Right knee pain     History reviewed. No pertinent surgical history.  There were no vitals filed for this visit.   Subjective Assessment - 02/16/17 0928    Subjective  No incident.  As I was swimming and I started noticing pain in my left shoulder.  I decided to see Dr Onnie Graham and try to treat this early  I noticing it most when i am swimming, freestyle.   Retinal tear stopped  PT .   PAin worse.  No injection  lately. No swimming since  November. .      Limitations  Lifting reaching     How long can you sit comfortably?  unlimited    How long can you stand comfortably?  unlimited    How long can you walk comfortably?  unlimited    Diagnostic tests  MRI nothing surgical     Currently in Pain?  Yes with movement    Pain Score  3     Pain Location  Shoulder    Pain Orientation  Left    Pain Descriptors / Indicators  Dull    Pain Type  Chronic pain    Pain Onset  More than a month ago    Pain Frequency  Intermittent    Aggravating Factors   using LT arm    Pain Relieving Factors  rest, not moving         [facility] PT Assessment - 02/16/17 0001      Assessment   Medical Diagnosis  Left shoulder pain, impingemtn    Referring Provider  Justice Britain, MD    Onset  Date/Surgical Date  -- fall 2018    Hand Dominance  Right    Prior Therapy  yes      Precautions   Precautions  None      Restrictions   Weight Bearing Restrictions  No      Balance Screen   Has the patient fallen in the past 6 months  No      Prior Function   Level of Independence  Independent      Cognition   Overall Cognitive Status  Within Functional Limits for tasks assessed      Observation/Other Assessments   Focus on Therapeutic Outcomes (FOTO)   38% limited        Posture/Postural Control   Postural Limitations  Rounded Shoulders;Forward head    Posture Comments  left anterior tilted scapular, lower trap and serratus weakness on left      AROM   Left Shoulder Extension  60 Degrees    Left Shoulder Flexion  148 Degrees  Left Shoulder ABduction  160 Degrees    Left Shoulder Internal Rotation  80 Degrees    Left Shoulder External Rotation  52 Degrees    Left Shoulder Horizontal ABduction  12 Degrees    Left Shoulder Horizontal ADduction  117 Degrees      Strength   Left Shoulder Flexion  5/5 discomfort    Left Shoulder Extension  5/5    Left Shoulder ABduction  5/5 discomfort    Left Shoulder Internal Rotation  5/5    Left Shoulder External Rotation  5/5 discomfort      Palpation   Palpation comment  No tenderness LT shoulder /scapula             Objective measurements completed on examination: See above findings.              PT Education - 02/16/17 1015    Education provided  Yes    Education Details  POC , HEP    Person(s) Educated  Patient    Methods  Explanation;Verbal cues;Handout;Tactile cues    Comprehension  Verbalized understanding;Returned demonstration       PT Short Term Goals - 02/16/17 0924      PT SHORT TERM GOAL #1   Title  He will be independent with initial HEp    Time  2    Period  Weeks    Status  New        PT Long Term Goals - 02/16/17 0925      PT LONG TERM GOAL #1   Title  Pt will return to  swimming activities without exacerbation of pain in left shoulder with overhead strokes    Time  6    Period  Weeks    Status  New      PT LONG TERM GOAL #2   Title  He will have full painfree ROM to return to swimming    Time  6    Period  Weeks    Status  New      PT LONG TERM GOAL #3   Title  Pt will be independent with advanced HEP for shoulder strength/ scapular stabilizers    Time  6    Period  Weeks    Status  New      PT LONG TERM GOAL #4   Title  "FOTO will improve from  38% limitation  to  23 % limitation   indicating improved functional use of left shoulder    Time  6    Period  Weeks    Status  New      PT LONG TERM GOAL #5   Title  He will demo awareness of good posture    Time  6    Period  Weeks    Status  New      PT LONG TERM GOAL #6   Title  --    Time  --    Period  --    Status  --             Plan - 02/16/17 1308    Clinical Impression Statement  Mr Greener presents with complaint of LT shoulder pain  . He was here 2 months ago and apparently did not complete the course of care.   He should improve with PT . He demo stiffness in LT shoulder but better than last episode of care though he reports more pain.  .       Clinical  Presentation  Stable    Rehab Potential  Good    PT Frequency  2x / week    PT Duration  6 weeks    PT Treatment/Interventions  Cryotherapy;Electrical Stimulation;Iontophoresis 4mg /ml Dexamethasone;Moist Heat;Ultrasound;Neuromuscular re-education;Therapeutic exercise;Therapeutic activities;Patient/family education;Manual techniques;Taping;Dry needling;Passive range of motion    PT Next Visit Plan  strengthening  and strenching ,modalities as needed    PT Home Exercise Plan  Pt with standing scapular stabilizers with green t band, standing isometric extension.     Consulted and Agree with Plan of Care  Patient       Patient will benefit from skilled therapeutic intervention in order to improve the following deficits and  impairments:  Pain, Impaired UE functional use, Postural dysfunction, Decreased strength, Decreased range of motion  Visit Diagnosis: Left shoulder pain, unspecified chronicity  Abnormal posture  Muscle weakness (generalized)  Stiffness of left shoulder, not elsewhere classified     Problem List There are no active problems to display for this patient.   Darrel Hoover  PT 02/16/2017, 10:23 AM  [facility] [address] [place], [place], [address] Phone: [phone]   Fax:  [phone]  Name: Chris Russell MRN: [id number] Date of Birth: October 24, 1955

## 2017-02-26 ENCOUNTER — Ambulatory Visit: Payer: 59

## 2017-02-26 DIAGNOSIS — M25512 Pain in left shoulder: Secondary | ICD-10-CM | POA: Diagnosis not present

## 2017-02-26 DIAGNOSIS — R293 Abnormal posture: Secondary | ICD-10-CM

## 2017-02-26 DIAGNOSIS — M25612 Stiffness of left shoulder, not elsewhere classified: Secondary | ICD-10-CM

## 2017-02-26 DIAGNOSIS — M6281 Muscle weakness (generalized): Secondary | ICD-10-CM | POA: Diagnosis not present

## 2017-02-26 NOTE — Patient Instructions (Signed)
Thoracic rotation quadra ped and supine (brettzel) x2 RT /Lt x10 rpes

## 2017-02-26 NOTE — Therapy (Signed)
[place], [place], [address] Phone: [phone]   Fax:  [phone]  Physical Therapy Treatment  Patient Details  Name: Chris Russell MRN: [id number] Date of Birth: October 09, 1955 Referring Provider: Justice Britain, MD   Encounter Date: 02/26/2017  PT End of Session - 02/26/17 0847    Visit Number  2    Number of Visits  12    Date for PT Re-Evaluation  03/27/17    Authorization Type  UMR    PT Start Time  [phone]    PT Stop Time  0930    PT Time Calculation (min)  44 min    Activity Tolerance  Patient tolerated treatment well    Behavior During Therapy  [facility] for tasks assessed/performed       Past Medical History:  Diagnosis Date  . Right knee pain     No past surgical history on file.  There were no vitals filed for this visit.  Subjective Assessment - 02/26/17 0848    Subjective  Exercise religious .     Currently in Pain?  No/denies                      [facility] Adult PT Treatment/Exercise - 02/26/17 0001      Exercises   Exercises  Shoulder      Shoulder Exercises: Prone   Other Prone Exercises  I, T Y, W x 10      Shoulder Exercises: Standing   Extension  Both;10 reps    Theraband Level (Shoulder Extension)  Level 3 (Green)    Row  Both;10 reps    Theraband Level (Shoulder Row)  Level 3 (Green)      Shoulder Exercises: Stretch   Internal Rotation Stretch  30 seconds    Internal Rotation Stretch Limitations  behind back             PT Education - 02/26/17 0933    Education provided  Yes    Education Details  HEP    Person(s) Educated  Patient    Methods  Explanation;Demonstration;Tactile cues;Verbal cues;Handout    Comprehension  Verbalized understanding;Returned demonstration       PT Short Term Goals - 02/26/17 0939      PT SHORT TERM GOAL #1   Title  He will be independent with initial HEp    Status  Achieved        PT Long Term Goals -  02/16/17 0925      PT LONG TERM GOAL #1   Title  Pt will return to swimming activities without exacerbation of pain in left shoulder with overhead strokes    Time  6    Period  Weeks    Status  New      PT LONG TERM GOAL #2   Title  He will have full painfree ROM to return to swimming    Time  6    Period  Weeks    Status  New      PT LONG TERM GOAL #3   Title  Pt will be independent with advanced HEP for shoulder strength/ scapular stabilizers    Time  6    Period  Weeks    Status  New      PT LONG TERM GOAL #4   Title  "FOTO will improve from  38% limitation  to  23 % limitation   indicating improved functional use of left  shoulder    Time  6    Period  Weeks    Status  New      PT LONG TERM GOAL #5   Title  He will demo awareness of good posture    Time  6    Period  Weeks    Status  New      PT LONG TERM GOAL #6   Title  --    Time  --    Period  --    Status  --            Plan - 02/26/17 5993    Clinical Impression Statement  No probelems with exercises and added thoracic rotatiuon stretching. No changes otherwise    PT Treatment/Interventions  Cryotherapy;Electrical Stimulation;Iontophoresis 4mg /ml Dexamethasone;Moist Heat;Ultrasound;Neuromuscular re-education;Therapeutic exercise;Therapeutic activities;Patient/family education;Manual techniques;Taping;Dry needling;Passive range of motion    PT Next Visit Plan  strengthening  and strenching ,modalities as needed  supine on foam roller    PT Home Exercise Plan  Pt with standing scapular stabilizers with green t band, standing isometric extension. thoracic spine rotation stretching    Consulted and Agree with Plan of Care  Patient       Patient will benefit from skilled therapeutic intervention in order to improve the following deficits and impairments:  Pain, Impaired UE functional use, Postural dysfunction, Decreased strength, Decreased range of motion  Visit Diagnosis: Left shoulder pain, unspecified  chronicity  Abnormal posture  Muscle weakness (generalized)  Stiffness of left shoulder, not elsewhere classified     Problem List There are no active problems to display for this patient.   Darrel Hoover  PT 02/26/2017, 9:40 AM  [facility] [address] [place], [place], [address] Phone: [phone]   Fax:  [phone]  Name: Chris Russell MRN: [id number] Date of Birth: 15-Apr-1955

## 2017-03-02 ENCOUNTER — Ambulatory Visit: Payer: 59

## 2017-03-04 ENCOUNTER — Encounter: Payer: Self-pay | Admitting: Physical Therapy

## 2017-03-04 ENCOUNTER — Ambulatory Visit: Payer: 59 | Admitting: Physical Therapy

## 2017-03-04 DIAGNOSIS — M25612 Stiffness of left shoulder, not elsewhere classified: Secondary | ICD-10-CM | POA: Diagnosis not present

## 2017-03-04 DIAGNOSIS — M6281 Muscle weakness (generalized): Secondary | ICD-10-CM | POA: Diagnosis not present

## 2017-03-04 DIAGNOSIS — M25512 Pain in left shoulder: Secondary | ICD-10-CM | POA: Diagnosis not present

## 2017-03-04 DIAGNOSIS — R293 Abnormal posture: Secondary | ICD-10-CM | POA: Diagnosis not present

## 2017-03-04 NOTE — Therapy (Signed)
[place], [place], [address] Phone: [phone]   Fax:  [phone]  Physical Therapy Treatment  Patient Details  Name: Chris Russell MRN: [id number] Date of Birth: 31-Mar-1955 Referring Provider: Justice Britain, MD   Encounter Date: 03/04/2017  PT End of Session - 03/04/17 1021    Visit Number  3    Number of Visits  12    Date for PT Re-Evaluation  03/27/17    Authorization Type  UMR    PT Start Time  1016    PT Stop Time  9509    PT Time Calculation (min)  42 min       Past Medical History:  Diagnosis Date  . Right knee pain     History reviewed. No pertinent surgical history.  There were no vitals filed for this visit.  Subjective Assessment - 03/04/17 1019    Subjective  Pain is okay at rest. Shoulder hurts when I move certain ways.    Currently in Pain?  Yes    Pain Score  3     Pain Location  Shoulder    Pain Orientation  Left    Pain Descriptors / Indicators  Discomfort    Aggravating Factors   reaching up, reaching across body, reaching back     Pain Relieving Factors  rest, not moving in those motions                       [facility] Adult PT Treatment/Exercise - 03/04/17 0001      Shoulder Exercises: Standing   Horizontal ABduction  20 reps blue    Extension  20 reps    Theraband Level (Shoulder Extension)  Level 4 (Blue)    Row  20 reps    Theraband Level (Shoulder Row)  Level 4 (Blue)      Shoulder Exercises: ROM/Strengthening   UBE (Upper Arm Bike)  UBE Level 1 cues for posture 2 min forward 2 min backward       Shoulder Exercises: Stretch   Corner Stretch  3 reps;30 seconds    Internal Rotation Stretch  30 seconds    Internal Rotation Stretch Limitations  behind back    Other Shoulder Stretches  thoracic rotation in quadruped 3 reps each way cues for modifications for shoulder pain    Other Shoulder Stretches  bretzel verses open books several reps each  way, cues for modifications for shoulder pain       Manual Therapy   Manual Therapy  Passive ROM    Joint Mobilization  inferior and posterior glide grade 3 left shoulder    Passive ROM  left shoulder flex, abdct, Er, IR , cross body stretch , posterior capsule stretch - most discomfort with ER              PT Education - 03/04/17 1116    Education provided  Yes    Education Details  HEP    Person(s) Educated  Patient    Methods  Explanation;Handout    Comprehension  Verbalized understanding       PT Short Term Goals - 02/26/17 0939      PT SHORT TERM GOAL #1   Title  He will be independent with initial HEp    Status  Achieved        PT Long Term Goals - 02/16/17 0925      PT LONG TERM GOAL #1   Title  Pt will return to swimming activities without exacerbation of pain in left shoulder with overhead strokes    Time  6    Period  Weeks    Status  New      PT LONG TERM GOAL #2   Title  He will have full painfree ROM to return to swimming    Time  6    Period  Weeks    Status  New      PT LONG TERM GOAL #3   Title  Pt will be independent with advanced HEP for shoulder strength/ scapular stabilizers    Time  6    Period  Weeks    Status  New      PT LONG TERM GOAL #4   Title  "FOTO will improve from  38% limitation  to  23 % limitation   indicating improved functional use of left shoulder    Time  6    Period  Weeks    Status  New      PT LONG TERM GOAL #5   Title  He will demo awareness of good posture    Time  6    Period  Weeks    Status  New      PT LONG TERM GOAL #6   Title  --    Time  --    Period  --    Status  --            Plan - 03/04/17 1116    Clinical Impression Statement  Review off all HEP per pt request-given pictures per pt request. Added doorway stretch to improve ER. cautioned to avoid high levels of pain with exercises.     PT Next Visit Plan  strengthening  and strenching ,modalities as needed  supine on foam roller     PT Home Exercise Plan  Pt with standing scapular stabilizers with green t band, standing isometric extension. thoracic spine rotation stretching, door way stretch    Consulted and Agree with Plan of Care  Patient       Patient will benefit from skilled therapeutic intervention in order to improve the following deficits and impairments:  Pain, Impaired UE functional use, Postural dysfunction, Decreased strength, Decreased range of motion  Visit Diagnosis: Left shoulder pain, unspecified chronicity  Abnormal posture  Muscle weakness (generalized)  Stiffness of left shoulder, not elsewhere classified     Problem List There are no active problems to display for this patient.   Dorene Ar, [place] 03/04/2017, 12:46 PM  [facility] [address] [place], [place], [address] Phone: [phone]   Fax:  [phone]  Name: Chris Russell MRN: [id number] Date of Birth: 1955-11-24

## 2017-03-09 ENCOUNTER — Ambulatory Visit: Payer: 59 | Attending: Orthopedic Surgery

## 2017-03-09 DIAGNOSIS — M25612 Stiffness of left shoulder, not elsewhere classified: Secondary | ICD-10-CM | POA: Diagnosis present

## 2017-03-09 DIAGNOSIS — R293 Abnormal posture: Secondary | ICD-10-CM

## 2017-03-09 DIAGNOSIS — M25512 Pain in left shoulder: Secondary | ICD-10-CM

## 2017-03-09 DIAGNOSIS — M6281 Muscle weakness (generalized): Secondary | ICD-10-CM

## 2017-03-09 NOTE — Therapy (Signed)
[place], [place], [address] Phone: [phone]   Fax:  [phone]  Physical Therapy Treatment  Patient Details  Name: Chris Russell MRN: [id number] Date of Birth: [birth year] Referring Provider: Justice Britain, MD   Encounter Date: 03/09/2017  PT End of Session - 03/09/17 0857    Visit Number  4    Number of Visits  12    Date for PT Re-Evaluation  03/27/17    Authorization Type  UMR    PT Start Time  0850    PT Stop Time  0930    PT Time Calculation (min)  40 min    Activity Tolerance  Patient tolerated treatment well    Behavior During Therapy  [facility] for tasks assessed/performed       Past Medical History:  Diagnosis Date  . Right knee pain     History reviewed. No pertinent surgical history.  There were no vitals filed for this visit.  Subjective Assessment - 03/09/17 0851    Subjective  More pain in past 3 days. Carried some items with weight but not sure that caused it.     Currently in Pain?  Yes    Pain Score  4     Pain Location  Shoulder    Pain Orientation  Left    Pain Descriptors / Indicators  Discomfort    Pain Type  Chronic pain    Pain Onset  More than a month ago    Pain Frequency  Intermittent    Aggravating Factors   using arm    Pain Relieving Factors  rest                      [facility] Adult PT Treatment/Exercise - 03/09/17 0001      Shoulder Exercises: Prone   Other Prone Exercises  I, T Y, W x 15    Other Prone Exercises  10 reps lift anth arm by LT hip and RT arm overhead end of pull trough  with frees tyle      Shoulder Exercises: Standing   Horizontal ABduction  20 reps black    ABduction  Limitations    ABduction Limitations  15 blue band behind hips with deperession    Extension  20 reps    Theraband Level (Shoulder Extension)  -- black    Row  20 reps    Theraband Level (Shoulder Row)  -- black      Shoulder Exercises: Body Blade   ABduction  15 seconds;3 reps    External Rotation  15 seconds;3 reps             PT Education - 03/09/17 0939    Education provided  Yes    Education Details  HEP    Person(s) Educated  Patient    Methods  Explanation;Demonstration;Tactile cues;Verbal cues;Handout    Comprehension  Returned demonstration;Verbalized understanding       PT Short Term Goals - 02/26/17 0939      PT SHORT TERM GOAL #1   Title  He will be independent with initial HEp    Status  Achieved        PT Long Term Goals - 03/09/17 0941      PT LONG TERM GOAL #1   Title  Pt will return to swimming activities without exacerbation of pain in left shoulder with overhead strokes    Status  On-going  PT LONG TERM GOAL #2   Title  He will have full painfree ROM to return to swimming    Status  On-going      PT LONG TERM GOAL #3   Title  Pt will be independent with advanced HEP for shoulder strength/ scapular stabilizers    Status  On-going      PT LONG TERM GOAL #4   Title  "FOTO will improve from  38% limitation  to  23 % limitation   indicating improved functional use of left shoulder    Status  Unable to assess      PT LONG TERM GOAL #5   Title  He will demo awareness of good posture    Status  On-going      PT LONG TERM GOAL #6   Title  left shoulder will return to 5/5 strength in left ER    Status  Unable to assess            Plan - 03/09/17 0858    Clinical Impression Statement  Soreness may be related to new stretches in door and thoracic rotation . He was able to do all exer today without incr pain with incr resistance.       Added to HEP .     PT Treatment/Interventions  Cryotherapy;Electrical Stimulation;Iontophoresis 4mg /ml Dexamethasone;Moist Heat;Ultrasound;Neuromuscular re-education;Therapeutic exercise;Therapeutic activities;Patient/family education;Manual techniques;Taping;Dry needling;Passive range of motion    PT Next Visit Plan  strengthening  and strenching  ,modalities as needed  supine on foam roller    PT Home Exercise Plan  Pt with standing scapular stabilizers with green t band, standing isometric extension. thoracic spine rotation stretching, door way stretch, behind back abduciton with band , swim position with LT arm at hip RT overhead    Consulted and Agree with Plan of Care  Patient       Patient will benefit from skilled therapeutic intervention in order to improve the following deficits and impairments:  Pain, Impaired UE functional use, Postural dysfunction, Decreased strength, Decreased range of motion  Visit Diagnosis: Left shoulder pain, unspecified chronicity  Abnormal posture  Muscle weakness (generalized)  Stiffness of left shoulder, not elsewhere classified     Problem List There are no active problems to display for this patient.   Darrel Hoover  PT 03/09/2017, 9:53 AM  [facility] [address] [place], [place], [address] Phone: [phone]   Fax:  [phone]  Name: Chris Russell MRN: [id number] Date of Birth: 1955-05-08

## 2017-03-09 NOTE — Patient Instructions (Signed)
Behind back abduction with scapula depression  And swim position with LT arm at hip RT overhead bot 1x/day 10-20 reps  Hold 3 wec

## 2017-03-11 ENCOUNTER — Encounter: Payer: 59 | Admitting: Physical Therapy

## 2017-03-16 ENCOUNTER — Ambulatory Visit: Payer: 59

## 2017-03-16 DIAGNOSIS — M25512 Pain in left shoulder: Secondary | ICD-10-CM

## 2017-03-16 DIAGNOSIS — R293 Abnormal posture: Secondary | ICD-10-CM

## 2017-03-16 DIAGNOSIS — M25612 Stiffness of left shoulder, not elsewhere classified: Secondary | ICD-10-CM | POA: Diagnosis not present

## 2017-03-16 DIAGNOSIS — M6281 Muscle weakness (generalized): Secondary | ICD-10-CM

## 2017-03-16 NOTE — Patient Instructions (Signed)
Bilateral flexion with band pull x 10 2x /day.    Standing pressure in to chair back  With walking back for flexion  x10  2x/day

## 2017-03-16 NOTE — Therapy (Signed)
[place], [place], [address] Phone: [phone]   Fax:  [phone]  Physical Therapy Treatment  Patient Details  Name: Chris Russell MRN: [id number] Date of Birth: May 07, 1955 Referring Provider: Justice Britain, MD   Encounter Date: 03/16/2017  PT End of Session - 03/16/17 0844    Visit Number  5    Number of Visits  12    Date for PT Re-Evaluation  03/27/17    Authorization Type  [facility]    PT Start Time  0845    PT Stop Time  0930    PT Time Calculation (min)  45 min    Activity Tolerance  Patient tolerated treatment well    Behavior During Therapy  [facility] for tasks assessed/performed       Past Medical History:  Diagnosis Date  . Right knee pain     No past surgical history on file.  There were no vitals filed for this visit.  Subjective Assessment - 03/16/17 0847    Subjective  About the same.     Currently in Pain?  Yes    Pain Score  3     Pain Location  Shoulder    Pain Orientation  Left    Pain Descriptors / Indicators  Discomfort    Pain Type  Chronic pain    Pain Frequency  Intermittent    Aggravating Factors   use of arm    Pain Relieving Factors  rest                      [facility] Adult PT Treatment/Exercise - 03/16/17 0001      Shoulder Exercises: Seated   Flexion  Limitations    Flexion Limitations  green band pulls hor abduct x 10      Shoulder Exercises: Prone   Other Prone Exercises  I, T Y, W x 15  3 pounds    Other Prone Exercises  10 reps lift LT arm by LT hip and RT arm overhead end of pull trough  with frees tyle.  then on elbows plank x5 10 sec      Shoulder Exercises: Standing   Other Standing Exercises  Pressing into top of chair x10 with constant pressure into chair walking away from chair      Shoulder Exercises: ROM/Strengthening   UBE (Upper Arm Bike)  UBE Level 3 cues for posture 3 min forward 3 min backward       Shoulder Exercises:  Stretch   Corner Stretch  2 reps;30 seconds    Internal Rotation Stretch  30 seconds    Internal Rotation Stretch Limitations  behind back    Other Shoulder Stretches  thoracic rotation in quadruped 10 reps each way cues for modifications for shoulder pain             PT Education - 03/16/17 0924    Education provided  Yes    Education Details  HEP    Person(s) Educated  Patient    Methods  Explanation;Demonstration;Verbal cues;Handout    Comprehension  Verbalized understanding;Returned demonstration       PT Short Term Goals - 02/26/17 0939      PT SHORT TERM GOAL #1   Title  He will be independent with initial HEp    Status  Achieved        PT Long Term Goals - 03/09/17 0941      PT LONG TERM GOAL #  1   Title  Pt will return to swimming activities without exacerbation of pain in left shoulder with overhead strokes    Status  On-going      PT LONG TERM GOAL #2   Title  He will have full painfree ROM to return to swimming    Status  On-going      PT LONG TERM GOAL #3   Title  Pt will be independent with advanced HEP for shoulder strength/ scapular stabilizers    Status  On-going      PT LONG TERM GOAL #4   Title  "FOTO will improve from  38% limitation  to  23 % limitation   indicating improved functional use of left shoulder    Status  Unable to assess      PT LONG TERM GOAL #5   Title  He will demo awareness of good posture    Status  On-going      PT LONG TERM GOAL #6   Title  left shoulder will return to 5/5 strength in left ER    Status  Unable to assess            Plan - 03/16/17 0844    Clinical Impression Statement  Not improving so far. Doing exercises without exacerbation of pain.     PT Treatment/Interventions  Cryotherapy;Electrical Stimulation;Iontophoresis 4mg /ml Dexamethasone;Moist Heat;Ultrasound;Neuromuscular re-education;Therapeutic exercise;Therapeutic activities;Patient/family education;Manual techniques;Taping;Dry needling;Passive  range of motion    PT Next Visit Plan  strengthening  and strenching ,modalities as needed  supine on foam roller    PT Home Exercise Plan  Pt with standing scapular stabilizers with green t band, standing isometric extension. thoracic spine rotation stretching, door way stretch, behind back abduciton with band , swim position with LT arm at hip RT overhead    Consulted and Agree with Plan of Care  Patient       Patient will benefit from skilled therapeutic intervention in order to improve the following deficits and impairments:  Pain, Impaired UE functional use, Postural dysfunction, Decreased strength, Decreased range of motion  Visit Diagnosis: Left shoulder pain, unspecified chronicity  Abnormal posture  Muscle weakness (generalized)  Stiffness of left shoulder, not elsewhere classified     Problem List There are no active problems to display for this patient.   Darrel Hoover PT 03/16/2017, 9:30 AM  [facility] [address] [place], [place], [address] Phone: [phone]   Fax:  [phone]  Name: ELIZEO RODRIQUES MRN: [id number] Date of Birth: Mar 12, 1955

## 2017-03-18 ENCOUNTER — Encounter: Payer: 59 | Admitting: Physical Therapy

## 2017-03-24 ENCOUNTER — Ambulatory Visit: Payer: 59

## 2017-03-30 ENCOUNTER — Ambulatory Visit: Payer: 59

## 2017-03-30 DIAGNOSIS — M25512 Pain in left shoulder: Secondary | ICD-10-CM | POA: Diagnosis not present

## 2017-03-30 DIAGNOSIS — M6281 Muscle weakness (generalized): Secondary | ICD-10-CM | POA: Diagnosis not present

## 2017-03-30 DIAGNOSIS — R293 Abnormal posture: Secondary | ICD-10-CM

## 2017-03-30 DIAGNOSIS — M25612 Stiffness of left shoulder, not elsewhere classified: Secondary | ICD-10-CM | POA: Diagnosis not present

## 2017-03-30 NOTE — Therapy (Addendum)
[place], [place], [address] Phone: [phone]   Fax:  [phone]  Physical Therapy Treatment/Discharge  Patient Details  Name: NOUR SCALISE MRN: [id number] Date of Birth: 1955/04/07 Referring Provider: Justice Britain, MD   Encounter Date: 03/30/2017  PT End of Session - 03/30/17 0843    Visit Number  6    Number of Visits  12    Date for PT Re-Evaluation  03/27/17    Authorization Type  [facility]    PT Start Time  0800    PT Stop Time  0840    PT Time Calculation (min)  40 min    Activity Tolerance  Patient tolerated treatment well    Behavior During Therapy  [facility] for tasks assessed/performed       Past Medical History:  Diagnosis Date  . Right knee pain     No past surgical history on file.  There were no vitals filed for this visit.  Subjective Assessment - 03/30/17 0841    Subjective  Feels he ahs done as much as he can in PT. Not ready for surgery with acromioplasty.   Wants to come in 1x/month for 3 months if neede for advance ment of HEP or questions.     Currently in Pain?  Yes    Pain Score  3     Pain Location  Shoulder    Pain Orientation  Left    Pain Descriptors / Indicators  Discomfort    Pain Type  Chronic pain    Pain Onset  More than a month ago    Pain Frequency  Intermittent    Aggravating Factors   using arm , Lying on LT shoulder    Pain Relieving Factors  rest    Multiple Pain Sites  No             Treatment session was spent in review of HEP mostly for stretching position and to incr time but decr load for stretching.                 PT Education - 03/30/17 [phone]    Education provided  Yes    Education Details  Second opinion with another PT as option. Benefit of waiting for another 3-6- monts    Person(s) Educated  Patient    Methods  Explanation    Comprehension  Verbalized understanding       PT Short Term Goals - 02/26/17 0939      PT SHORT TERM GOAL #1   Title  He will be independent with initial HEp    Status  Achieved        PT Long Term Goals - 03/30/17 0845      PT LONG TERM GOAL #1   Title  Pt will return to swimming activities without exacerbation of pain in left shoulder with overhead strokes    Status  On-going      PT LONG TERM GOAL #2   Title  He will have full painfree ROM to return to swimming    Status  On-going      PT LONG TERM GOAL #3   Title  Pt will be independent with advanced HEP for shoulder strength/ scapular stabilizers    Status  Partially Met      PT LONG TERM GOAL #4   Title  "FOTO will improve from  38% limitation  to  23 % limitation   indicating improved functional  use of left shoulder    Status  Unable to assess      PT LONG TERM GOAL #5   Title  He will demo awareness of good posture    Baseline  able to verbalize and demo understanding    Status  Achieved      PT LONG TERM GOAL #6   Title  left shoulder will return to 5/5 strength in left ER    Baseline  4+/5 today with rotation aflexion and abduction due to discomfort    Status  On-going            Plan - 03/30/17 0844    Clinical Impression Statement  Clair Gulling  feels he has enough exercises and wants to continue at home and touch base over next 3 months 1-2x/ month if needed.   This is a good idea.  Suggested more pro;longed stretching with less pressure may progress this over time with less pain.     PT Treatment/Interventions  Cryotherapy;Electrical Stimulation;Iontophoresis 37m/ml Dexamethasone;Moist Heat;Ultrasound;Neuromuscular re-education;Therapeutic exercise;Therapeutic activities;Patient/family education;Manual techniques;Taping;Dry needling;Passive range of motion    PT Next Visit Plan  strengthening  and strenching ,modalities as needed  supine on foam roller    PT Home Exercise Plan  Pt with standing scapular stabilizers with green t band, standing isometric extension. thoracic spine rotation stretching,  door way stretch, behind back abduciton with band , swim position with LT arm at hip RT overhead    Consulted and Agree with Plan of Care  Patient       Patient will benefit from skilled therapeutic intervention in order to improve the following deficits and impairments:  Pain, Impaired UE functional use, Postural dysfunction, Decreased strength, Decreased range of motion  Visit Diagnosis: Abnormal posture  Left shoulder pain, unspecified chronicity  Muscle weakness (generalized)  Stiffness of left shoulder, not elsewhere classified     Problem List There are no active problems to display for this patient.   CDarrel Hoover PT 03/30/2017, 8:47 AM  [facility] [address]Phone: [phone]  Fax:  [phone] Name: JEYOEL THROGMORTONMRN: [id number] of Birth: [birth year] PHYSICAL THERAPY DISCHARGE SUMMARY  Visits from Start of Care: 6  Current functional level related to goals / functional outcomes: See above   Remaining deficits: See above   Education / Equipment: HEP Plan: Patient agrees to discharge.  Patient goals were not met. Patient is being discharged due to not returning since the last visit.  ?????   SPearson ForsterPT  06/01/17

## 2017-03-30 NOTE — Addendum Note (Signed)
Addended by: Darrel Hoover on: 03/30/2017 08:53 AM   Modules accepted: Orders

## 2017-04-13 DIAGNOSIS — M25511 Pain in right shoulder: Secondary | ICD-10-CM | POA: Diagnosis not present

## 2017-04-16 DIAGNOSIS — M25511 Pain in right shoulder: Secondary | ICD-10-CM | POA: Diagnosis not present

## 2017-04-21 DIAGNOSIS — M25511 Pain in right shoulder: Secondary | ICD-10-CM | POA: Diagnosis not present

## 2017-04-24 DIAGNOSIS — M25511 Pain in right shoulder: Secondary | ICD-10-CM | POA: Diagnosis not present

## 2017-04-28 DIAGNOSIS — M25511 Pain in right shoulder: Secondary | ICD-10-CM | POA: Diagnosis not present

## 2017-05-05 DIAGNOSIS — M25511 Pain in right shoulder: Secondary | ICD-10-CM | POA: Diagnosis not present

## 2017-05-07 DIAGNOSIS — M25511 Pain in right shoulder: Secondary | ICD-10-CM | POA: Diagnosis not present

## 2017-05-13 DIAGNOSIS — M25511 Pain in right shoulder: Secondary | ICD-10-CM | POA: Diagnosis not present

## 2017-05-13 MED FILL — VALSARTAN-HCTZ 320-12.5 MG: 320-12.5 | 90 days supply | Qty: 45 | Fill #3

## 2017-05-13 MED FILL — AMLODIPINE BESYLATE 5 MG TA: 5 | 90 days supply | Qty: 90 | Fill #3

## 2017-05-15 DIAGNOSIS — M25511 Pain in right shoulder: Secondary | ICD-10-CM | POA: Diagnosis not present

## 2017-05-18 DIAGNOSIS — M25511 Pain in right shoulder: Secondary | ICD-10-CM | POA: Diagnosis not present

## 2017-05-19 DIAGNOSIS — M7542 Impingement syndrome of left shoulder: Secondary | ICD-10-CM | POA: Diagnosis not present

## 2017-05-19 DIAGNOSIS — M7502 Adhesive capsulitis of left shoulder: Secondary | ICD-10-CM | POA: Diagnosis not present

## 2017-05-25 DIAGNOSIS — M25511 Pain in right shoulder: Secondary | ICD-10-CM | POA: Diagnosis not present

## 2017-05-28 DIAGNOSIS — M25511 Pain in right shoulder: Secondary | ICD-10-CM | POA: Diagnosis not present

## 2017-06-03 DIAGNOSIS — M25511 Pain in right shoulder: Secondary | ICD-10-CM | POA: Diagnosis not present

## 2017-06-10 DIAGNOSIS — M25511 Pain in right shoulder: Secondary | ICD-10-CM | POA: Diagnosis not present

## 2017-06-17 DIAGNOSIS — M25511 Pain in right shoulder: Secondary | ICD-10-CM | POA: Diagnosis not present

## 2017-06-17 DIAGNOSIS — M7502 Adhesive capsulitis of left shoulder: Secondary | ICD-10-CM | POA: Diagnosis not present

## 2017-06-22 MED FILL — ROSUVASTATIN CALCIUM 20 MG: 20 | 90 days supply | Qty: 90 | Fill #0

## 2017-06-26 DIAGNOSIS — M25511 Pain in right shoulder: Secondary | ICD-10-CM | POA: Diagnosis not present

## 2017-07-16 DIAGNOSIS — M25511 Pain in right shoulder: Secondary | ICD-10-CM | POA: Diagnosis not present

## 2017-07-27 DIAGNOSIS — Z0000 Encounter for general adult medical examination without abnormal findings: Secondary | ICD-10-CM | POA: Diagnosis not present

## 2017-07-27 DIAGNOSIS — N529 Male erectile dysfunction, unspecified: Secondary | ICD-10-CM | POA: Diagnosis not present

## 2017-07-27 DIAGNOSIS — Z125 Encounter for screening for malignant neoplasm of prostate: Secondary | ICD-10-CM | POA: Diagnosis not present

## 2017-07-27 DIAGNOSIS — E559 Vitamin D deficiency, unspecified: Secondary | ICD-10-CM | POA: Diagnosis not present

## 2017-07-27 DIAGNOSIS — G4700 Insomnia, unspecified: Secondary | ICD-10-CM | POA: Diagnosis not present

## 2017-07-27 DIAGNOSIS — B360 Pityriasis versicolor: Secondary | ICD-10-CM | POA: Diagnosis not present

## 2017-07-27 DIAGNOSIS — Z91030 Bee allergy status: Secondary | ICD-10-CM | POA: Diagnosis not present

## 2017-07-27 DIAGNOSIS — E782 Mixed hyperlipidemia: Secondary | ICD-10-CM | POA: Diagnosis not present

## 2017-07-27 DIAGNOSIS — Z79899 Other long term (current) drug therapy: Secondary | ICD-10-CM | POA: Diagnosis not present

## 2017-07-27 DIAGNOSIS — I10 Essential (primary) hypertension: Secondary | ICD-10-CM | POA: Diagnosis not present

## 2017-07-30 MED FILL — VARDENAFIL HCL 20 MG TABS: 20 | 30 days supply | Qty: 6 | Fill #0

## 2017-08-10 MED FILL — VALSARTAN-HCTZ 320-12.5 MG: 320-12.5 | 90 days supply | Qty: 90 | Fill #0

## 2017-08-10 MED FILL — AMLODIPINE BESYLATE 5 MG TA: 5 | 90 days supply | Qty: 90 | Fill #0

## 2017-08-11 DIAGNOSIS — H35413 Lattice degeneration of retina, bilateral: Secondary | ICD-10-CM | POA: Diagnosis not present

## 2017-09-22 DIAGNOSIS — H5213 Myopia, bilateral: Secondary | ICD-10-CM | POA: Diagnosis not present

## 2017-09-22 DIAGNOSIS — H524 Presbyopia: Secondary | ICD-10-CM | POA: Diagnosis not present

## 2017-09-22 DIAGNOSIS — H52223 Regular astigmatism, bilateral: Secondary | ICD-10-CM | POA: Diagnosis not present

## 2017-10-20 MED FILL — ZOLPIDEM TARTRATE 5 MG TAB: 5 | 30 days supply | Qty: 30 | Fill #0

## 2017-10-21 MED FILL — AMLODIPINE BESYLATE 5 MG TA: 5 | 90 days supply | Qty: 90 | Fill #1

## 2018-01-11 DIAGNOSIS — J029 Acute pharyngitis, unspecified: Secondary | ICD-10-CM | POA: Diagnosis not present

## 2018-01-11 DIAGNOSIS — I10 Essential (primary) hypertension: Secondary | ICD-10-CM | POA: Diagnosis not present

## 2018-01-11 MED FILL — LIDOCAINE 2% VISCOUS SOLN: 2 | 2 days supply | Qty: 100 | Fill #0

## 2018-01-13 MED FILL — HYDROCODONE-CHLORPHEN ER SU: 10-8 | 10 days supply | Qty: 100 | Fill #0

## 2018-01-13 MED FILL — ZOLPIDEM TARTRATE 5 MG TAB: 5 | 30 days supply | Qty: 30 | Fill #1

## 2018-01-28 MED FILL — ROSUVASTATIN CALCIUM 20 MG: 20 | 90 days supply | Qty: 90 | Fill #1

## 2018-01-28 MED FILL — AMLODIPINE BESYLATE 5 MG TA: 5 | 90 days supply | Qty: 90 | Fill #2

## 2018-02-01 MED FILL — VALSARTAN-HCTZ 320-12.5 MG: 320-12.5 | 90 days supply | Qty: 45 | Fill #0

## 2018-02-08 NOTE — Progress Notes (Signed)
Cardiology Office Note   Date:  02/09/2018   ID:  JAVIS ABBOUD, DOB [birth year], MRN [id number]  PCP:  Marius Ditch, MD  Cardiologist:   Caroljean Monsivais Martinique, MD   Chief Complaint  Patient presents with  . Hypertension  . Palpitations      History of Present Illness: Chris Russell is a 63 y.o. male who presents for evaluation of palpitations. He is a Software engineer for [employer]. He has a longstanding history of HTN dating back to age 77. Really didn't start on medical therapy until he was 89. Strong family history of HTN. About 10 years ago he had a normal nuclear stress test and an Echo showing an "atheletic heart". On his last physical his BP was 140/90 and it was recommended that he increase his valsartan HCT but he has been reluctant to do so. BP at home has ranged from 622-633 systolic. Pulse is typically in the mid 51s.  Over the past month he has noted significant increase in palpitations. He feels skipping in his heart beat sometimes every 5th beat. No associated dizziness or syncope. He did walk yesterday and felt really bad. Noted increase palpitations and BP was elevated. He did take an extra dose of valsartan HCT and felt better. He is having palpitations daily. He drinks a couple of glasses of wine/day and drinks 2 cups of coffee daily. He is under increased stress. He is getting ready to retire this next month. His dog recently died. A good friend is ill.     Past Medical History:  Diagnosis Date  . Hyperlipidemia   . Hypertension   . Right knee pain     Past Surgical History:  Procedure Laterality Date  . APPENDECTOMY    . VASECTOMY       Current Outpatient Medications  Medication Sig Dispense Refill  . amLODipine (NORVASC) 5 MG tablet Take 1 tablet by mouth daily.    Marland Kitchen aspirin EC 81 MG tablet Take 1 tablet by mouth daily.    Marland Kitchen HYDROCHLOROTHIAZIDE PO Take 6.25 mg by mouth daily.     . rosuvastatin (CRESTOR) 5 MG tablet Take 1 tablet by mouth daily.    .  valsartan (DIOVAN) 160 MG tablet Take 160 mg daily by mouth.     No current facility-administered medications for this visit.     Allergies:   Patient has no allergy information on record.    Social History:  The patient  reports that he has never smoked. He has never used smokeless tobacco. He reports current alcohol use of about 14.0 standard drinks of alcohol per week.   Family History:  The patient's family history includes Dementia in his mother; Hypertension in his mother; Pulmonary fibrosis in his father; Rheumatic fever in his brother; Stroke in his sister.    ROS:  Please see the history of present illness.   Otherwise, review of systems are positive for none.   All other systems are reviewed and negative.    PHYSICAL EXAM: VS:  BP (!) 160/90 (BP Location: Left Arm)   Pulse (!) 56   Ht 5\' 10"  (1.778 m)   Wt 188 lb (85.3 kg)   BMI 26.98 kg/m  , BMI Body mass index is 26.98 kg/m. GEN: Well nourished, well developed, in no acute distress  HEENT: normal  Neck: no JVD, carotid bruits, or masses Cardiac: RRR; there is a soft 3-5/4 systolic murmur RUSB. no rubs, or gallops,no edema  Respiratory:  clear to  auscultation bilaterally, normal work of breathing GI: soft, nontender, nondistended, + BS MS: no deformity or atrophy  Skin: warm and dry, no rash Neuro:  Strength and sensation are intact Psych: euthymic mood, full affect   EKG:  EKG is ordered today. The ekg ordered today demonstrates sinus brady rate 56. LVH by voltage. I have personally reviewed and interpreted this study.    Recent Labs: No results found for requested labs within last 8760 hours.    Lipid Panel No results found for: CHOL, TRIG, HDL, CHOLHDL, VLDL, LDLCALC, LDLDIRECT    Wt Readings from Last 3 Encounters:  02/09/18 188 lb (85.3 kg)  10/02/15 184 lb (83.5 kg)      Other studies Reviewed: Additional studies/ records that were reviewed today include:  Dated June 2019. Normal CBC, CMET,  TSH. Cholesterol 184, triglycerides 92, HDL 63, LDL 103.    ASSESSMENT AND PLAN:  1.  Palpitations - symptoms c/w PVCs. May be related to hypertensive heart disease and fluctuating BP. Some increased stressors recently. Will have him wear a 24 hour holter monitor to assess arrhythmia type and frequency. He is going to reduce his Etoh and caffeine use 2. HTN with hypertensive heart disease. LVH by Ecg criteria. Will update Echo. He is going to double his dose of Valsartan HCT to achieve better BP control.  3. HLD on lipitor.   Depending on initial evaluation with Holter and Echo may consider stress testing if he has high PVC burden or LV dysfunction.   Current medicines are reviewed at length with the patient today.  The patient does not have concerns regarding medicines.  The following changes have been made:  Double Valsartan HCT  Labs/ tests ordered today include:   Orders Placed This Encounter  Procedures  . Holter monitor - 24 hour  . ECHOCARDIOGRAM COMPLETE     Disposition:   FU after above studies.   Signed, Kaspian Muccio Martin[place], MD  02/09/2018 10:38 AM    [facility] [address] [place], [place], [address] Phone [phone], Fax [phone]

## 2018-02-09 ENCOUNTER — Ambulatory Visit (INDEPENDENT_AMBULATORY_CARE_PROVIDER_SITE_OTHER): Payer: 59 | Admitting: Cardiology

## 2018-02-09 ENCOUNTER — Encounter: Payer: Self-pay | Admitting: Cardiology

## 2018-02-09 VITALS — BP 160/90 | HR 56 | Ht 70.0 in | Wt 188.0 lb

## 2018-02-09 DIAGNOSIS — I10 Essential (primary) hypertension: Secondary | ICD-10-CM

## 2018-02-09 DIAGNOSIS — R002 Palpitations: Secondary | ICD-10-CM | POA: Diagnosis not present

## 2018-02-09 NOTE — Patient Instructions (Signed)
Medication Instructions:  Continue same medications If you need a refill on your cardiac medications before your next appointment, please call your pharmacy.   Lab work: None ordered   Testing/Procedures:  Schedule Echo Schedule 24 hr holter monitor  Follow-Up: At [facility], you and your health needs are our priority.  As part of our continuing mission to provide you with exceptional heart care, we have created designated Provider Care Teams.  These Care Teams include your primary Cardiologist (physician) and Advanced Practice Providers (APPs -  Physician Assistants and Nurse Practitioners) who all work together to provide you with the care you need, when you need it.  Schedule follow up appointment with Dr.Jordan after test

## 2018-02-11 ENCOUNTER — Ambulatory Visit (HOSPITAL_COMMUNITY): Payer: 59 | Attending: Cardiology

## 2018-02-11 ENCOUNTER — Other Ambulatory Visit: Payer: Self-pay

## 2018-02-11 DIAGNOSIS — I10 Essential (primary) hypertension: Secondary | ICD-10-CM | POA: Insufficient documentation

## 2018-02-11 DIAGNOSIS — R002 Palpitations: Secondary | ICD-10-CM | POA: Diagnosis not present

## 2018-02-12 ENCOUNTER — Telehealth: Payer: Self-pay

## 2018-02-12 ENCOUNTER — Encounter: Payer: Self-pay | Admitting: Cardiology

## 2018-02-12 NOTE — Telephone Encounter (Signed)
Echo results sent to PCP Dr.Robert Inda Merlin via epic.

## 2018-02-17 ENCOUNTER — Ambulatory Visit (INDEPENDENT_AMBULATORY_CARE_PROVIDER_SITE_OTHER): Payer: 59

## 2018-02-17 DIAGNOSIS — R002 Palpitations: Secondary | ICD-10-CM

## 2018-02-17 DIAGNOSIS — I10 Essential (primary) hypertension: Secondary | ICD-10-CM | POA: Diagnosis not present

## 2018-02-28 NOTE — Progress Notes (Signed)
Cardiology Office Note   Date:  03/05/2018   ID:  [name], DOB May 29, 1955, MRN [id number]  PCP:  Chris Huddle, MD  Cardiologist:   Chris Sarsfield Martinique, MD   Chief Complaint  Patient presents with  . Hypertension      History of Present Illness: Chris Russell is a 63 y.o. male who is seen for follow up hypertensive heart disease and palpitations. 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 Really didn't start on medical therapy until he was 71. Strong family history of HTN. About 10 years ago he had a normal nuclear stress test and an Echo showing an "atheletic heart". On his last physical his BP was 140/90 and it was recommended that he increase his valsartan HCT but he has been reluctant to do so. BP at home has ranged from 284-132 systolic. Pulse is typically in the mid 22s.   On his last visit we increased his valsartan HCT. Echo showed ? moderate LVH with normal systolic function. Mild MR,TR. Holter showed rare PVCs and PACs.   On follow up today he brings a BP diary. This shows excellent control since increasing his valsartan HCT. He still notes a lot of skipping in his pulse when he takes it but really has mild symptoms.     Past Medical History:  Diagnosis Date  . Hyperlipidemia   . Hypertension   . Right knee pain     Past Surgical History:  Procedure Laterality Date  . APPENDECTOMY    . VASECTOMY       Current Outpatient Medications  Medication Sig Dispense Refill  . amLODipine (NORVASC) 5 MG tablet Take 1 tablet by mouth daily.    Marland Kitchen aspirin EC 81 MG tablet Take 1 tablet by mouth daily.    Marland Kitchen HYDROCHLOROTHIAZIDE PO Take 12.5 mg by mouth daily.    . rosuvastatin (CRESTOR) 5 MG tablet Take 1 tablet by mouth daily.    . valsartan (DIOVAN) 160 MG tablet Take 320 mg by mouth daily.     No current facility-administered medications for this visit.     Allergies:   Patient has no allergy information on  record.    Social History:  The patient  reports that he has never smoked. He has never used smokeless tobacco. He reports current alcohol use of about 14.0 standard drinks of alcohol per week. He reports that he does not use drugs.   Family History:  The patient's family history includes Dementia in his mother; Hypertension in his mother; Pulmonary fibrosis in his father; Rheumatic fever in his brother; Stroke in his sister.    ROS:  Please see the history of present illness.   Otherwise, review of systems are positive for none.   All other systems are reviewed and negative.    PHYSICAL EXAM: VS:  BP (!) 153/97   Pulse 64   Ht 5\' 10"  (1.778 m)   Wt 187 lb 12.8 oz (85.2 kg)   SpO2 100%   BMI 26.95 kg/m  , BMI Body mass index is 26.95 kg/m. GEN: Well nourished, well developed, in no acute distress  HEENT: normal  Neck: no JVD, carotid bruits, or masses Cardiac: RRR; there is a soft 1/6 systolic murmur RUSB. no rubs, or gallops,no edema  Respiratory:  clear to auscultation bilaterally, normal work of breathing GI: soft, nontender, nondistended, + BS MS: no deformity or atrophy  Skin: warm and  dry, no rash Neuro:  Strength and sensation are intact Psych: euthymic mood, full affect   EKG:  EKG is not ordered today.   Recent Labs: No results found for requested labs within last 8760 hours.    Lipid Panel No results found for: CHOL, TRIG, HDL, CHOLHDL, VLDL, LDLCALC, LDLDIRECT    Wt Readings from Last 3 Encounters:  03/05/18 187 lb 12.8 oz (85.2 kg)  02/09/18 188 lb (85.3 kg)  10/02/15 184 lb (83.5 kg)      Other studies Reviewed: Additional studies/ records that were reviewed today include:  Dated June 2019. Normal CBC, CMET, TSH. Cholesterol 184, triglycerides 92, HDL 63, LDL 103.   Echo 02/11/18: Study Conclusions  - Left ventricle: The cavity size was normal. There was moderate   concentric hypertrophy. Systolic function was normal. The   estimated ejection  fraction was in the range of 55% to 60%. Wall   motion was normal; there were no regional wall motion   abnormalities. Doppler parameters are consistent with abnormal   left ventricular relaxation (grade 1 diastolic dysfunction). - Aortic valve: There was mild regurgitation. - Aortic root: The aortic root was normal in size. - Right ventricle: Systolic function was normal. - Right atrium: The atrium was normal in size. - Tricuspid valve: There was mild regurgitation. - Pulmonary arteries: Systolic pressure was within the normal   range. - Inferior vena cava: The vessel was normal in size. - Pericardium, extracardiac: There was no pericardial effusion.  Holter 02/17/18: Study Highlights    Normal sinus rhythm  Rare isolated PVCs  Rare PACs and couplets.     ASSESSMENT AND PLAN:  1.  Palpitations - Holter really showed rare ectopy with isolated PACs and PVCs comprising only 0.08% of beats.  Minimally symptomatic. Will avoid stimulants.  2. HTN - I personally reviewed his Echo and LV measurements are not really consistent with moderate LVH. LV wall thickness at most appears upper normal. BP has improved with increase Valsartan HCT  3. HLD on lipitor.   Depending on initial evaluation with Holter and Echo may consider stress testing if he has high PVC burden or LV dysfunction.  Labs/ tests ordered today include:   No orders of the defined types were placed in this encounter.    Disposition:   FU PRN.   Signed, Chris Cranmore Martinique, MD  03/05/2018 12:50 PM    [facility] [address] [place], [place], [address] Phone [phone], Fax [phone]

## 2018-03-05 ENCOUNTER — Encounter: Payer: Self-pay | Admitting: Cardiology

## 2018-03-05 ENCOUNTER — Ambulatory Visit (INDEPENDENT_AMBULATORY_CARE_PROVIDER_SITE_OTHER): Payer: 59 | Admitting: Cardiology

## 2018-03-05 VITALS — BP 153/97 | HR 64 | Ht 70.0 in | Wt 187.8 lb

## 2018-03-05 DIAGNOSIS — R002 Palpitations: Secondary | ICD-10-CM | POA: Diagnosis not present

## 2018-03-05 DIAGNOSIS — I10 Essential (primary) hypertension: Secondary | ICD-10-CM | POA: Diagnosis not present

## 2018-03-22 MED FILL — VALSARTAN-HCTZ 320-12.5 MG: 320-12.5 | 45 days supply | Qty: 45 | Fill #0 | Status: TO

## 2018-05-05 MED FILL — ROSUVASTATIN CALCIUM 20 MG: 20 | 90 days supply | Qty: 90 | Fill #0

## 2018-05-05 MED FILL — VALSARTAN-HCTZ 320-12.5 MG: 320-12.5 | 30 days supply | Qty: 30 | Fill #0

## 2018-05-05 MED FILL — AMLODIPINE BESYLATE 5 MG TA: 5 | 90 days supply | Qty: 90 | Fill #0

## 2018-06-03 MED FILL — ZOLPIDEM TARTRATE 5 MG TAB: 5 | 30 days supply | Qty: 30 | Fill #0

## 2018-06-03 MED FILL — VALSARTAN-HCTZ 320-12.5 MG: 320-12.5 | 30 days supply | Qty: 30 | Fill #1 | Status: TO

## 2018-07-08 MED FILL — VALSARTAN-HCTZ 320-12.5 MG: 320-12.5 | 30 days supply | Qty: 30 | Fill #0

## 2018-07-16 MED FILL — ROSUVASTATIN CALCIUM 20 MG: 20 | 90 days supply | Qty: 90 | Fill #0

## 2018-07-16 MED FILL — AMLODIPINE BESYLATE 5 MG TA: 5 | 90 days supply | Qty: 90 | Fill #0

## 2018-08-05 MED FILL — VALSARTAN-HCTZ 320-12.5 MG: 320-12.5 | 30 days supply | Qty: 30 | Fill #1

## 2018-08-10 MED FILL — AZITHROMYCIN 500 MG TABLET: 500 | 3 days supply | Qty: 3 | Fill #0

## 2018-08-18 MED FILL — AMOXICILLIN 875 MG TABS: 875 | 7 days supply | Qty: 14 | Fill #0

## 2018-09-08 MED FILL — VALSARTAN-HCTZ 320-12.5 MG: 320-12.5 | 30 days supply | Qty: 30 | Fill #2

## 2018-09-14 DIAGNOSIS — H35413 Lattice degeneration of retina, bilateral: Secondary | ICD-10-CM | POA: Diagnosis not present

## 2018-09-14 DIAGNOSIS — H33311 Horseshoe tear of retina without detachment, right eye: Secondary | ICD-10-CM | POA: Diagnosis not present

## 2018-09-14 DIAGNOSIS — H2513 Age-related nuclear cataract, bilateral: Secondary | ICD-10-CM | POA: Diagnosis not present

## 2018-09-17 MED FILL — ZOLPIDEM TARTRATE 5 MG TAB: 5 | 30 days supply | Qty: 30 | Fill #0

## 2018-10-04 DIAGNOSIS — I10 Essential (primary) hypertension: Secondary | ICD-10-CM | POA: Diagnosis not present

## 2018-10-04 DIAGNOSIS — Z91030 Bee allergy status: Secondary | ICD-10-CM | POA: Diagnosis not present

## 2018-10-04 DIAGNOSIS — B360 Pityriasis versicolor: Secondary | ICD-10-CM | POA: Diagnosis not present

## 2018-10-04 DIAGNOSIS — N529 Male erectile dysfunction, unspecified: Secondary | ICD-10-CM | POA: Diagnosis not present

## 2018-10-04 DIAGNOSIS — Z79899 Other long term (current) drug therapy: Secondary | ICD-10-CM | POA: Diagnosis not present

## 2018-10-04 DIAGNOSIS — E559 Vitamin D deficiency, unspecified: Secondary | ICD-10-CM | POA: Diagnosis not present

## 2018-10-04 DIAGNOSIS — Z0000 Encounter for general adult medical examination without abnormal findings: Secondary | ICD-10-CM | POA: Diagnosis not present

## 2018-10-04 DIAGNOSIS — G4700 Insomnia, unspecified: Secondary | ICD-10-CM | POA: Diagnosis not present

## 2018-10-04 DIAGNOSIS — E782 Mixed hyperlipidemia: Secondary | ICD-10-CM | POA: Diagnosis not present

## 2018-10-06 DIAGNOSIS — Z125 Encounter for screening for malignant neoplasm of prostate: Secondary | ICD-10-CM | POA: Diagnosis not present

## 2018-10-06 DIAGNOSIS — E559 Vitamin D deficiency, unspecified: Secondary | ICD-10-CM | POA: Diagnosis not present

## 2018-10-06 DIAGNOSIS — Z79899 Other long term (current) drug therapy: Secondary | ICD-10-CM | POA: Diagnosis not present

## 2018-10-06 DIAGNOSIS — E782 Mixed hyperlipidemia: Secondary | ICD-10-CM | POA: Diagnosis not present

## 2018-10-06 DIAGNOSIS — I10 Essential (primary) hypertension: Secondary | ICD-10-CM | POA: Diagnosis not present

## 2018-10-06 MED FILL — VALSARTAN-HCTZ 320-12.5 MG: 320-12.5 | 30 days supply | Qty: 30 | Fill #3

## 2018-11-02 MED FILL — VALSARTAN-HCTZ 320-12.5 MG: 320-12.5 | 30 days supply | Qty: 30 | Fill #4

## 2018-11-02 MED FILL — AMLODIPINE BESYLATE 5 MG TA: 5 | 90 days supply | Qty: 90 | Fill #1

## 2018-11-10 MED FILL — FLUARIX QUADRIVALENT 0.5 ML: 0.5 | 1 days supply | Qty: 1 | Fill #0

## 2018-11-16 DIAGNOSIS — H52223 Regular astigmatism, bilateral: Secondary | ICD-10-CM | POA: Diagnosis not present

## 2018-11-16 DIAGNOSIS — H524 Presbyopia: Secondary | ICD-10-CM | POA: Diagnosis not present

## 2018-11-16 DIAGNOSIS — H5213 Myopia, bilateral: Secondary | ICD-10-CM | POA: Diagnosis not present

## 2018-11-16 DIAGNOSIS — H35371 Puckering of macula, right eye: Secondary | ICD-10-CM | POA: Diagnosis not present

## 2018-11-30 MED FILL — VALSARTAN-HCTZ 320-12.5 MG: 320-12.5 | 30 days supply | Qty: 30 | Fill #5

## 2019-01-03 MED FILL — VALSARTAN-HCTZ 320-12.5 MG: 320-12.5 | 90 days supply | Qty: 90 | Fill #0

## 2019-01-04 MED FILL — CHLORHEXIDINE 0.12% RINSE: 0.12 | 17 days supply | Qty: 473 | Fill #0

## 2019-01-04 MED FILL — ZOLPIDEM TARTRATE 5 MG TAB: 5 | 88 days supply | Qty: 22 | Fill #0

## 2019-01-04 MED FILL — AZITHROMYCIN 250 MG TABLET: 250 | 1 days supply | Qty: 2 | Fill #0

## 2019-01-24 MED FILL — AMLODIPINE BESYLATE 5 MG TA: 5 | 90 days supply | Qty: 90 | Fill #2

## 2019-03-07 DIAGNOSIS — H35413 Lattice degeneration of retina, bilateral: Secondary | ICD-10-CM | POA: Diagnosis not present

## 2019-03-07 DIAGNOSIS — H43813 Vitreous degeneration, bilateral: Secondary | ICD-10-CM | POA: Diagnosis not present

## 2019-03-07 DIAGNOSIS — H35371 Puckering of macula, right eye: Secondary | ICD-10-CM | POA: Diagnosis not present

## 2019-03-07 DIAGNOSIS — H31093 Other chorioretinal scars, bilateral: Secondary | ICD-10-CM | POA: Diagnosis not present

## 2019-03-15 MED FILL — ZOLPIDEM TARTRATE 5 MG TAB: 5 | 88 days supply | Qty: 22 | Fill #1

## 2019-04-04 DIAGNOSIS — H33312 Horseshoe tear of retina without detachment, left eye: Secondary | ICD-10-CM | POA: Diagnosis not present

## 2019-04-04 DIAGNOSIS — H35371 Puckering of macula, right eye: Secondary | ICD-10-CM | POA: Diagnosis not present

## 2019-04-04 DIAGNOSIS — H35413 Lattice degeneration of retina, bilateral: Secondary | ICD-10-CM | POA: Diagnosis not present

## 2019-04-04 DIAGNOSIS — H31093 Other chorioretinal scars, bilateral: Secondary | ICD-10-CM | POA: Diagnosis not present

## 2019-04-05 MED FILL — VALSARTAN-HCTZ 320-12.5 MG: 320-12.5 | 90 days supply | Qty: 90 | Fill #1

## 2019-04-19 MED FILL — diazePAM 5 MG TABS: 5 | 2 days supply | Qty: 3 | Fill #0

## 2019-04-25 DIAGNOSIS — H35413 Lattice degeneration of retina, bilateral: Secondary | ICD-10-CM | POA: Diagnosis not present

## 2019-04-25 DIAGNOSIS — H33312 Horseshoe tear of retina without detachment, left eye: Secondary | ICD-10-CM | POA: Diagnosis not present

## 2019-04-25 MED FILL — AMLODIPINE BESYLATE 5 MG TA: 5 | 90 days supply | Qty: 90 | Fill #0

## 2019-06-09 MED FILL — ZOLPIDEM TARTRATE 5 MG TAB: 5 | 64 days supply | Qty: 16 | Fill #2

## 2019-06-22 MED FILL — VALSARTAN-HCTZ 320-12.5 MG: 320-12.5 | 90 days supply | Qty: 90 | Fill #2

## 2019-07-13 DIAGNOSIS — M720 Palmar fascial fibromatosis [Dupuytren]: Secondary | ICD-10-CM | POA: Diagnosis not present

## 2019-07-26 DIAGNOSIS — M25511 Pain in right shoulder: Secondary | ICD-10-CM | POA: Diagnosis not present

## 2019-07-26 MED FILL — AMLODIPINE BESYLATE 5 MG TA: 5 | 90 days supply | Qty: 90 | Fill #1

## 2019-08-02 DIAGNOSIS — M25511 Pain in right shoulder: Secondary | ICD-10-CM | POA: Diagnosis not present

## 2019-08-09 DIAGNOSIS — M25511 Pain in right shoulder: Secondary | ICD-10-CM | POA: Diagnosis not present

## 2019-08-12 DIAGNOSIS — M25511 Pain in right shoulder: Secondary | ICD-10-CM | POA: Diagnosis not present

## 2019-08-16 DIAGNOSIS — M25511 Pain in right shoulder: Secondary | ICD-10-CM | POA: Diagnosis not present

## 2019-08-23 DIAGNOSIS — M25511 Pain in right shoulder: Secondary | ICD-10-CM | POA: Diagnosis not present

## 2019-08-24 ENCOUNTER — Other Ambulatory Visit (HOSPITAL_COMMUNITY): Payer: Self-pay | Admitting: Internal Medicine

## 2019-08-30 DIAGNOSIS — M25511 Pain in right shoulder: Secondary | ICD-10-CM | POA: Diagnosis not present

## 2019-09-05 MED FILL — SHINGRIX 50 MCG SUS: 50 | 2 days supply | Qty: 1 | Fill #0

## 2019-09-06 DIAGNOSIS — M25511 Pain in right shoulder: Secondary | ICD-10-CM | POA: Diagnosis not present

## 2019-09-13 MED FILL — ZOLPIDEM TARTRATE 5 MG TAB: 5 | 60 days supply | Qty: 16 | Fill #0

## 2019-09-20 DIAGNOSIS — M25511 Pain in right shoulder: Secondary | ICD-10-CM | POA: Diagnosis not present

## 2019-09-23 MED FILL — VALSARTAN-HCTZ 320-12.5 MG: 320-12.5 | 90 days supply | Qty: 90 | Fill #3

## 2019-09-23 MED FILL — ZOLPIDEM TARTRATE 5 MG TAB: 5 | 60 days supply | Qty: 16 | Fill #0

## 2019-10-04 DIAGNOSIS — M25511 Pain in right shoulder: Secondary | ICD-10-CM | POA: Diagnosis not present

## 2019-10-11 MED FILL — AMLODIPINE BESYLATE 5 MG TA: 5 | 90 days supply | Qty: 90 | Fill #2

## 2019-10-18 DIAGNOSIS — B360 Pityriasis versicolor: Secondary | ICD-10-CM | POA: Diagnosis not present

## 2019-10-18 DIAGNOSIS — Z0000 Encounter for general adult medical examination without abnormal findings: Secondary | ICD-10-CM | POA: Diagnosis not present

## 2019-10-18 DIAGNOSIS — G4700 Insomnia, unspecified: Secondary | ICD-10-CM | POA: Diagnosis not present

## 2019-10-18 DIAGNOSIS — Z125 Encounter for screening for malignant neoplasm of prostate: Secondary | ICD-10-CM | POA: Diagnosis not present

## 2019-10-18 DIAGNOSIS — E559 Vitamin D deficiency, unspecified: Secondary | ICD-10-CM | POA: Diagnosis not present

## 2019-10-18 DIAGNOSIS — E782 Mixed hyperlipidemia: Secondary | ICD-10-CM | POA: Diagnosis not present

## 2019-10-18 DIAGNOSIS — N529 Male erectile dysfunction, unspecified: Secondary | ICD-10-CM | POA: Diagnosis not present

## 2019-10-18 DIAGNOSIS — M25511 Pain in right shoulder: Secondary | ICD-10-CM | POA: Diagnosis not present

## 2019-10-18 DIAGNOSIS — I10 Essential (primary) hypertension: Secondary | ICD-10-CM | POA: Diagnosis not present

## 2019-10-18 DIAGNOSIS — Z79899 Other long term (current) drug therapy: Secondary | ICD-10-CM | POA: Diagnosis not present

## 2019-10-28 MED FILL — FLUARIX QUADRIVALENT 0.5 ML: 0.5 | 1 days supply | Qty: 1 | Fill #0

## 2019-11-01 ENCOUNTER — Ambulatory Visit: Payer: 59 | Attending: Internal Medicine

## 2019-11-01 DIAGNOSIS — Z23 Encounter for immunization: Secondary | ICD-10-CM

## 2019-11-01 NOTE — Progress Notes (Signed)
   Covid-19 Vaccination Clinic  Name:  Chris Russell    MRN: [id number] DOB: 10-07-1955  11/01/2019  Chris Russell was observed post Covid-19 immunization for 15 minutes without incident. He was provided with Vaccine Information Sheet and instruction to access the V-Safe system.   Chris Russell was instructed to call 911 with any severe reactions post vaccine: Marland Kitchen Difficulty breathing  . Swelling of face and throat  . A fast heartbeat  . A bad rash all over body  . Dizziness and weakness

## 2019-11-02 DIAGNOSIS — M25511 Pain in right shoulder: Secondary | ICD-10-CM | POA: Diagnosis not present

## 2019-11-14 MED FILL — SHINGRIX 50 MCG SUS: 50 | 2 days supply | Qty: 1 | Fill #1

## 2019-11-22 DIAGNOSIS — M25511 Pain in right shoulder: Secondary | ICD-10-CM | POA: Diagnosis not present

## 2019-11-25 MED FILL — ZOLPIDEM TARTRATE 5 MG TAB: 5 | 64 days supply | Qty: 16 | Fill #0

## 2019-12-05 DIAGNOSIS — H43813 Vitreous degeneration, bilateral: Secondary | ICD-10-CM | POA: Diagnosis not present

## 2019-12-05 DIAGNOSIS — H35371 Puckering of macula, right eye: Secondary | ICD-10-CM | POA: Diagnosis not present

## 2019-12-05 DIAGNOSIS — H31093 Other chorioretinal scars, bilateral: Secondary | ICD-10-CM | POA: Diagnosis not present

## 2019-12-05 DIAGNOSIS — H35413 Lattice degeneration of retina, bilateral: Secondary | ICD-10-CM | POA: Diagnosis not present

## 2019-12-13 DIAGNOSIS — M25511 Pain in right shoulder: Secondary | ICD-10-CM | POA: Diagnosis not present

## 2019-12-22 ENCOUNTER — Other Ambulatory Visit: Payer: 59

## 2019-12-22 DIAGNOSIS — Z20822 Contact with and (suspected) exposure to covid-19: Secondary | ICD-10-CM

## 2019-12-23 LAB — SARS-COV-2, NAA 2 DAY TAT

## 2019-12-23 LAB — NOVEL CORONAVIRUS, NAA: SARS-CoV-2, NAA: NOT DETECTED

## 2019-12-26 ENCOUNTER — Other Ambulatory Visit (HOSPITAL_COMMUNITY): Payer: Self-pay | Admitting: Internal Medicine

## 2019-12-26 MED FILL — VALSARTAN-HCTZ 320-12.5 MG: 320-12.5 | 90 days supply | Qty: 90 | Fill #0

## 2020-01-19 ENCOUNTER — Other Ambulatory Visit (HOSPITAL_COMMUNITY): Payer: Self-pay | Admitting: Internal Medicine

## 2020-01-19 MED FILL — AMLODIPINE BESYLATE 5 MG TA: 5 | 90 days supply | Qty: 90 | Fill #0

## 2020-01-31 MED FILL — ZOLPIDEM TARTRATE 5 MG TAB: 5 | 64 days supply | Qty: 16 | Fill #1

## 2020-03-15 DIAGNOSIS — H52223 Regular astigmatism, bilateral: Secondary | ICD-10-CM | POA: Diagnosis not present

## 2020-03-15 DIAGNOSIS — H524 Presbyopia: Secondary | ICD-10-CM | POA: Diagnosis not present

## 2020-03-15 DIAGNOSIS — H5213 Myopia, bilateral: Secondary | ICD-10-CM | POA: Diagnosis not present

## 2020-03-26 ENCOUNTER — Other Ambulatory Visit (HOSPITAL_COMMUNITY): Payer: Self-pay | Admitting: Internal Medicine

## 2020-03-26 MED FILL — VALSARTAN-HCTZ 320-12.5 MG: 320-12.5 | 90 days supply | Qty: 90 | Fill #1

## 2020-04-03 MED FILL — ZOLPIDEM TARTRATE 5 MG TAB: 5 | 64 days supply | Qty: 16 | Fill #0

## 2020-04-16 ENCOUNTER — Ambulatory Visit: Payer: 59 | Admitting: Dermatology

## 2020-05-21 ENCOUNTER — Ambulatory Visit: Payer: 59 | Admitting: Dermatology

## 2020-06-04 ENCOUNTER — Ambulatory Visit: Payer: 59 | Attending: Internal Medicine

## 2020-06-04 ENCOUNTER — Other Ambulatory Visit (HOSPITAL_BASED_OUTPATIENT_CLINIC_OR_DEPARTMENT_OTHER): Payer: Self-pay

## 2020-06-04 DIAGNOSIS — Z23 Encounter for immunization: Secondary | ICD-10-CM

## 2020-06-04 MED ORDER — TETANUS-DIPHTH-ACELL PERTUSSIS 5-2.5-18.5 LF-MCG/0.5 IM SUSY
PREFILLED_SYRINGE | INTRAMUSCULAR | 0 refills | Status: AC
  Filled 2020-06-04: qty 0.5, 1d supply, fill #0

## 2020-06-04 MED ORDER — PFIZER-BIONT COVID-19 VAC-TRIS 30 MCG/0.3ML IM SUSP
INTRAMUSCULAR | 0 refills | Status: AC
  Filled 2020-06-04: qty 0.3, 1d supply, fill #0

## 2020-06-04 NOTE — Progress Notes (Signed)
   Covid-19 Vaccination Clinic  Name:  Chris Russell    MRN: [id number] DOB: 10/04/1955  06/04/2020  Mr. Chris Russell was observed post Covid-19 immunization for 15 minutes without incident. He was provided with Vaccine Information Sheet and instruction to access the V-Safe system.   Mr. Chris Russell was instructed to call 911 with any severe reactions post vaccine: Marland Kitchen Difficulty breathing  . Swelling of face and throat  . A fast heartbeat  . A bad rash all over body  . Dizziness and weakness   Immunizations Administered    Name Date Dose VIS Date Route   PFIZER Comrnaty(Gray TOP) Covid-19 Vaccine 06/04/2020  1:33 PM 0.3 mL 01/12/2020 Intramuscular   Manufacturer: [place]   Lot: [id number]   NDC: [phone]

## 2020-06-05 ENCOUNTER — Other Ambulatory Visit (HOSPITAL_COMMUNITY): Payer: Self-pay

## 2020-06-05 ENCOUNTER — Other Ambulatory Visit (HOSPITAL_BASED_OUTPATIENT_CLINIC_OR_DEPARTMENT_OTHER): Payer: Self-pay

## 2020-06-05 MED FILL — Zolpidem Tartrate Tab 5 MG: ORAL | 64 days supply | Qty: 16 | Fill #0 | Status: AC

## 2020-06-07 ENCOUNTER — Other Ambulatory Visit (HOSPITAL_COMMUNITY): Payer: Self-pay

## 2020-06-28 ENCOUNTER — Other Ambulatory Visit (HOSPITAL_COMMUNITY): Payer: Self-pay

## 2020-06-28 MED FILL — Valsartan-Hydrochlorothiazide Tab 320-12.5 MG: ORAL | 90 days supply | Qty: 90 | Fill #0 | Status: AC

## 2020-06-28 MED FILL — Amlodipine Besylate Tab 5 MG (Base Equivalent): ORAL | 90 days supply | Qty: 90 | Fill #0 | Status: CN

## 2020-06-29 ENCOUNTER — Other Ambulatory Visit (HOSPITAL_COMMUNITY): Payer: Self-pay

## 2020-07-14 MED FILL — Amlodipine Besylate Tab 5 MG (Base Equivalent): ORAL | 90 days supply | Qty: 90 | Fill #0 | Status: AC

## 2020-07-16 ENCOUNTER — Other Ambulatory Visit (HOSPITAL_COMMUNITY): Payer: Self-pay

## 2020-08-08 ENCOUNTER — Other Ambulatory Visit (HOSPITAL_COMMUNITY): Payer: Self-pay

## 2020-08-08 MED FILL — Zolpidem Tartrate Tab 5 MG: ORAL | 64 days supply | Qty: 16 | Fill #1 | Status: AC

## 2020-09-18 ENCOUNTER — Other Ambulatory Visit (HOSPITAL_COMMUNITY): Payer: Self-pay

## 2020-09-18 MED FILL — Valsartan-Hydrochlorothiazide Tab 320-12.5 MG: ORAL | 90 days supply | Qty: 90 | Fill #1 | Status: AC

## 2020-10-01 ENCOUNTER — Other Ambulatory Visit (HOSPITAL_COMMUNITY): Payer: Self-pay

## 2020-10-01 MED ORDER — ROSUVASTATIN CALCIUM 5 MG PO TABS
ORAL_TABLET | ORAL | 3 refills | Status: AC
  Filled 2020-10-01: qty 120, 84d supply, fill #0

## 2020-10-02 ENCOUNTER — Other Ambulatory Visit (HOSPITAL_COMMUNITY): Payer: Self-pay

## 2020-10-02 MED ORDER — ROSUVASTATIN CALCIUM 10 MG PO TABS
ORAL_TABLET | ORAL | 3 refills | Status: AC
  Filled 2020-10-02 (×2): qty 60, 84d supply, fill #0
  Filled 2020-12-17: qty 60, 84d supply, fill #1
  Filled 2021-03-08: qty 60, 84d supply, fill #2
  Filled 2021-03-13: qty 60, 84d supply, fill #0

## 2020-10-03 ENCOUNTER — Other Ambulatory Visit (HOSPITAL_COMMUNITY): Payer: Self-pay

## 2020-10-03 MED ORDER — ZOLPIDEM TARTRATE 5 MG PO TABS
ORAL_TABLET | Freq: Every day | ORAL | 3 refills | Status: AC | PRN
  Filled 2020-10-03 – 2020-10-09 (×2): qty 16, 64d supply, fill #0
  Filled 2020-12-09: qty 16, 64d supply, fill #1
  Filled 2021-02-07: qty 16, 64d supply, fill #2

## 2020-10-09 ENCOUNTER — Other Ambulatory Visit (HOSPITAL_COMMUNITY): Payer: Self-pay

## 2020-10-18 ENCOUNTER — Other Ambulatory Visit (HOSPITAL_COMMUNITY): Payer: Self-pay

## 2020-10-19 ENCOUNTER — Other Ambulatory Visit (HOSPITAL_COMMUNITY): Payer: Self-pay

## 2020-10-24 ENCOUNTER — Other Ambulatory Visit (HOSPITAL_COMMUNITY): Payer: Self-pay

## 2020-10-24 ENCOUNTER — Other Ambulatory Visit: Payer: Self-pay | Admitting: Internal Medicine

## 2020-10-24 DIAGNOSIS — E782 Mixed hyperlipidemia: Secondary | ICD-10-CM | POA: Diagnosis not present

## 2020-10-24 DIAGNOSIS — N529 Male erectile dysfunction, unspecified: Secondary | ICD-10-CM | POA: Diagnosis not present

## 2020-10-24 DIAGNOSIS — Z125 Encounter for screening for malignant neoplasm of prostate: Secondary | ICD-10-CM | POA: Diagnosis not present

## 2020-10-24 DIAGNOSIS — B360 Pityriasis versicolor: Secondary | ICD-10-CM | POA: Diagnosis not present

## 2020-10-24 DIAGNOSIS — I499 Cardiac arrhythmia, unspecified: Secondary | ICD-10-CM | POA: Diagnosis not present

## 2020-10-24 DIAGNOSIS — Z8249 Family history of ischemic heart disease and other diseases of the circulatory system: Secondary | ICD-10-CM | POA: Diagnosis not present

## 2020-10-24 DIAGNOSIS — Z79899 Other long term (current) drug therapy: Secondary | ICD-10-CM | POA: Diagnosis not present

## 2020-10-24 DIAGNOSIS — G4700 Insomnia, unspecified: Secondary | ICD-10-CM | POA: Diagnosis not present

## 2020-10-24 DIAGNOSIS — E559 Vitamin D deficiency, unspecified: Secondary | ICD-10-CM | POA: Diagnosis not present

## 2020-10-24 DIAGNOSIS — Z0001 Encounter for general adult medical examination with abnormal findings: Secondary | ICD-10-CM | POA: Diagnosis not present

## 2020-10-24 DIAGNOSIS — I10 Essential (primary) hypertension: Secondary | ICD-10-CM | POA: Diagnosis not present

## 2020-10-29 ENCOUNTER — Other Ambulatory Visit (HOSPITAL_COMMUNITY): Payer: Self-pay

## 2020-10-29 MED ORDER — AMLODIPINE BESYLATE 5 MG PO TABS
5.0000 mg | ORAL_TABLET | Freq: Every day | ORAL | 3 refills | Status: AC
  Filled 2020-10-29: qty 90, 90d supply, fill #0
  Filled 2021-01-17: qty 90, 90d supply, fill #1
  Filled 2021-04-29 – 2021-05-01 (×2): qty 90, 90d supply, fill #2

## 2020-10-31 ENCOUNTER — Other Ambulatory Visit (HOSPITAL_COMMUNITY): Payer: Self-pay

## 2020-11-14 ENCOUNTER — Ambulatory Visit
Admission: RE | Admit: 2020-11-14 | Discharge: 2020-11-14 | Disposition: A | Discharge status: Home / Self Care | Payer: 59 | Source: Ambulatory Visit | Attending: Internal Medicine | Admitting: Internal Medicine

## 2020-11-14 DIAGNOSIS — Z8249 Family history of ischemic heart disease and other diseases of the circulatory system: Secondary | ICD-10-CM

## 2020-11-20 DIAGNOSIS — L281 Prurigo nodularis: Secondary | ICD-10-CM | POA: Diagnosis not present

## 2020-11-20 DIAGNOSIS — D2271 Melanocytic nevi of right lower limb, including hip: Secondary | ICD-10-CM | POA: Diagnosis not present

## 2020-11-20 DIAGNOSIS — D2239 Melanocytic nevi of other parts of face: Secondary | ICD-10-CM | POA: Diagnosis not present

## 2020-11-20 DIAGNOSIS — L821 Other seborrheic keratosis: Secondary | ICD-10-CM | POA: Diagnosis not present

## 2020-11-20 DIAGNOSIS — D225 Melanocytic nevi of trunk: Secondary | ICD-10-CM | POA: Diagnosis not present

## 2020-11-24 DIAGNOSIS — Z1212 Encounter for screening for malignant neoplasm of rectum: Secondary | ICD-10-CM | POA: Diagnosis not present

## 2020-11-24 DIAGNOSIS — Z1211 Encounter for screening for malignant neoplasm of colon: Secondary | ICD-10-CM | POA: Diagnosis not present

## 2020-11-29 ENCOUNTER — Other Ambulatory Visit (HOSPITAL_COMMUNITY): Payer: Self-pay

## 2020-11-29 MED ORDER — INFLUENZA VAC SPLIT QUAD 0.5 ML IM SUSY
0.5000 mL | PREFILLED_SYRINGE | INTRAMUSCULAR | 0 refills | Status: AC
  Filled 2020-11-29: qty 0.5, 1d supply, fill #0

## 2020-11-30 LAB — COLOGUARD: COLOGUARD: NEGATIVE

## 2020-12-10 ENCOUNTER — Other Ambulatory Visit (HOSPITAL_COMMUNITY): Payer: Self-pay

## 2020-12-11 DIAGNOSIS — H31093 Other chorioretinal scars, bilateral: Secondary | ICD-10-CM | POA: Diagnosis not present

## 2020-12-11 DIAGNOSIS — H35362 Drusen (degenerative) of macula, left eye: Secondary | ICD-10-CM | POA: Diagnosis not present

## 2020-12-11 DIAGNOSIS — H35373 Puckering of macula, bilateral: Secondary | ICD-10-CM | POA: Diagnosis not present

## 2020-12-11 DIAGNOSIS — H43813 Vitreous degeneration, bilateral: Secondary | ICD-10-CM | POA: Diagnosis not present

## 2020-12-11 DIAGNOSIS — H35413 Lattice degeneration of retina, bilateral: Secondary | ICD-10-CM | POA: Diagnosis not present

## 2020-12-17 ENCOUNTER — Other Ambulatory Visit (HOSPITAL_COMMUNITY): Payer: Self-pay

## 2020-12-18 ENCOUNTER — Other Ambulatory Visit (HOSPITAL_COMMUNITY): Payer: Self-pay

## 2020-12-18 MED ORDER — VALSARTAN-HYDROCHLOROTHIAZIDE 320-12.5 MG PO TABS
1.0000 | ORAL_TABLET | Freq: Every day | ORAL | 3 refills | Status: AC
  Filled 2020-12-18: qty 90, 90d supply, fill #0
  Filled 2021-03-18: qty 90, 90d supply, fill #1

## 2021-01-17 ENCOUNTER — Other Ambulatory Visit (HOSPITAL_COMMUNITY): Payer: Self-pay

## 2021-01-18 ENCOUNTER — Other Ambulatory Visit (HOSPITAL_COMMUNITY): Payer: Self-pay

## 2021-01-18 DIAGNOSIS — I10 Essential (primary) hypertension: Secondary | ICD-10-CM | POA: Diagnosis not present

## 2021-01-18 DIAGNOSIS — Z79899 Other long term (current) drug therapy: Secondary | ICD-10-CM | POA: Diagnosis not present

## 2021-01-18 DIAGNOSIS — E782 Mixed hyperlipidemia: Secondary | ICD-10-CM | POA: Diagnosis not present

## 2021-01-18 MED ORDER — ROSUVASTATIN CALCIUM 10 MG PO TABS
10.0000 mg | ORAL_TABLET | Freq: Every day | ORAL | 3 refills | Status: AC
  Filled 2021-01-18 – 2021-04-29 (×2): qty 90, 90d supply, fill #0

## 2021-02-08 ENCOUNTER — Other Ambulatory Visit (HOSPITAL_COMMUNITY): Payer: Self-pay

## 2021-03-09 ENCOUNTER — Other Ambulatory Visit (HOSPITAL_COMMUNITY): Payer: Self-pay

## 2021-03-13 ENCOUNTER — Other Ambulatory Visit (HOSPITAL_COMMUNITY): Payer: Self-pay

## 2021-03-18 ENCOUNTER — Other Ambulatory Visit (HOSPITAL_COMMUNITY): Payer: Self-pay

## 2021-04-03 ENCOUNTER — Other Ambulatory Visit (HOSPITAL_COMMUNITY): Payer: Self-pay

## 2021-04-03 MED ORDER — ZOLPIDEM TARTRATE 5 MG PO TABS
ORAL_TABLET | Freq: Every day | ORAL | 3 refills | Status: AC
  Filled 2021-04-03 – 2021-04-11 (×2): qty 16, 64d supply, fill #0

## 2021-04-05 ENCOUNTER — Other Ambulatory Visit (HOSPITAL_COMMUNITY): Payer: Self-pay

## 2021-04-11 ENCOUNTER — Other Ambulatory Visit (HOSPITAL_COMMUNITY): Payer: Self-pay

## 2021-04-29 ENCOUNTER — Other Ambulatory Visit (HOSPITAL_COMMUNITY): Payer: Self-pay

## 2021-05-01 ENCOUNTER — Other Ambulatory Visit (HOSPITAL_COMMUNITY): Payer: Self-pay

## 2021-06-03 DIAGNOSIS — E782 Mixed hyperlipidemia: Secondary | ICD-10-CM | POA: Diagnosis not present

## 2021-06-03 DIAGNOSIS — Z79899 Other long term (current) drug therapy: Secondary | ICD-10-CM | POA: Diagnosis not present

## 2021-06-03 DIAGNOSIS — Z23 Encounter for immunization: Secondary | ICD-10-CM | POA: Diagnosis not present

## 2021-06-03 DIAGNOSIS — I10 Essential (primary) hypertension: Secondary | ICD-10-CM | POA: Diagnosis not present

## 2021-10-24 DIAGNOSIS — B360 Pityriasis versicolor: Secondary | ICD-10-CM | POA: Diagnosis not present

## 2021-10-24 DIAGNOSIS — G4700 Insomnia, unspecified: Secondary | ICD-10-CM | POA: Diagnosis not present

## 2021-10-24 DIAGNOSIS — Z91030 Bee allergy status: Secondary | ICD-10-CM | POA: Diagnosis not present

## 2021-10-24 DIAGNOSIS — E782 Mixed hyperlipidemia: Secondary | ICD-10-CM | POA: Diagnosis not present

## 2021-10-24 DIAGNOSIS — Z1211 Encounter for screening for malignant neoplasm of colon: Secondary | ICD-10-CM | POA: Diagnosis not present

## 2021-10-24 DIAGNOSIS — N529 Male erectile dysfunction, unspecified: Secondary | ICD-10-CM | POA: Diagnosis not present

## 2021-10-24 DIAGNOSIS — Z0000 Encounter for general adult medical examination without abnormal findings: Secondary | ICD-10-CM | POA: Diagnosis not present

## 2021-10-24 DIAGNOSIS — Z79899 Other long term (current) drug therapy: Secondary | ICD-10-CM | POA: Diagnosis not present

## 2021-10-24 DIAGNOSIS — E559 Vitamin D deficiency, unspecified: Secondary | ICD-10-CM | POA: Diagnosis not present

## 2021-10-24 DIAGNOSIS — Z8249 Family history of ischemic heart disease and other diseases of the circulatory system: Secondary | ICD-10-CM | POA: Diagnosis not present

## 2021-10-24 DIAGNOSIS — Z125 Encounter for screening for malignant neoplasm of prostate: Secondary | ICD-10-CM | POA: Diagnosis not present

## 2021-10-24 DIAGNOSIS — I10 Essential (primary) hypertension: Secondary | ICD-10-CM | POA: Diagnosis not present

## 2021-11-27 ENCOUNTER — Other Ambulatory Visit (HOSPITAL_COMMUNITY): Payer: Self-pay | Admitting: Internal Medicine

## 2021-11-27 DIAGNOSIS — G4700 Insomnia, unspecified: Secondary | ICD-10-CM | POA: Diagnosis not present

## 2021-11-27 DIAGNOSIS — I10 Essential (primary) hypertension: Secondary | ICD-10-CM | POA: Diagnosis not present

## 2021-11-27 DIAGNOSIS — I517 Cardiomegaly: Secondary | ICD-10-CM

## 2021-12-12 ENCOUNTER — Ambulatory Visit (HOSPITAL_COMMUNITY): Payer: Medicare HMO | Attending: Internal Medicine

## 2021-12-12 DIAGNOSIS — I517 Cardiomegaly: Secondary | ICD-10-CM | POA: Diagnosis not present

## 2021-12-12 DIAGNOSIS — I10 Essential (primary) hypertension: Secondary | ICD-10-CM | POA: Diagnosis not present

## 2021-12-12 LAB — ECHOCARDIOGRAM COMPLETE
Area-P 1/2: 4.21 cm2
P 1/2 time: 567 msec
S' Lateral: 2.6 cm

## 2022-02-12 DIAGNOSIS — R69 Illness, unspecified: Secondary | ICD-10-CM | POA: Diagnosis not present

## 2022-02-25 DIAGNOSIS — M25562 Pain in left knee: Secondary | ICD-10-CM | POA: Diagnosis not present

## 2022-03-15 DIAGNOSIS — R69 Illness, unspecified: Secondary | ICD-10-CM | POA: Diagnosis not present

## 2022-03-18 IMAGING — CT CT CARDIAC CORONARY ARTERY CALCIUM SCORE
3 series · 14 of 20 positions shown, 16 images · non-contrast
Comparison: None.

CLINICAL DATA: 65-year-old Caucasian male with history of
hyperlipidemia, hypertension and family history of heart disease.

EXAM:
CT CARDIAC CORONARY ARTERY CALCIUM SCORE
TECHNIQUE: Non-contrast imaging through the heart was performed using
prospective ECG gating. Image post processing was performed on an
independent workstation, allowing for quantitative analysis of the
heart and coronary arteries. Note that this exam targets the heart
and the chest was not imaged in its entirety.

[Series 2: calcium scoring 2.00 qr36 bestdiast 70% hrt calciu · axial · 0.45mm/px · z∈[+1784,+1880]mm · 4 of 82 slices shown]
[im 17/82  vessel]
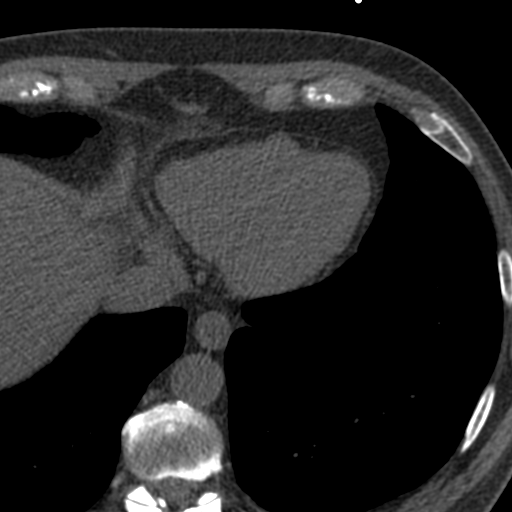
[im 33/82  vessel]
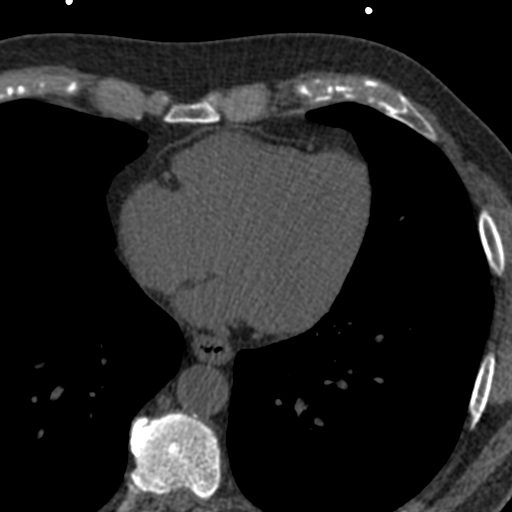
[im 49/82  vessel]
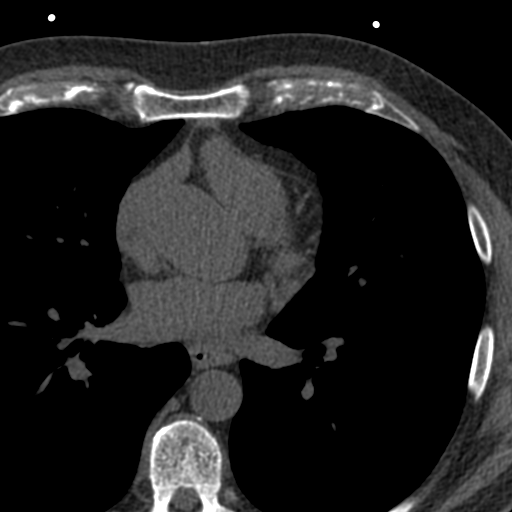
[im 65/82  vessel]
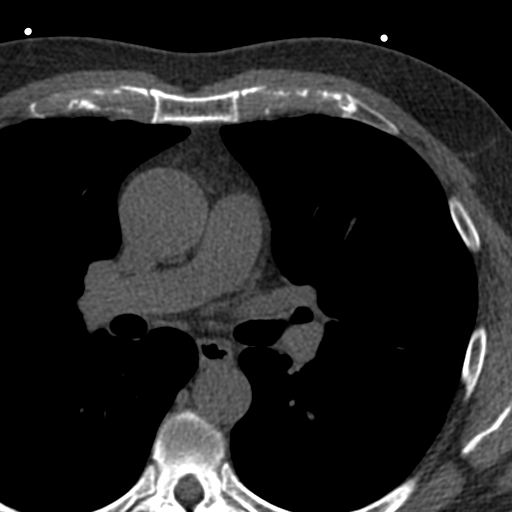

[Series 3: calcium scoring 2.00 br40 bestdiast 70% axial · axial · 0.66mm/px · z∈[+1734,+1878]mm · 5 of 109 slices shown, 7 images]
[im 19/109  vessel]
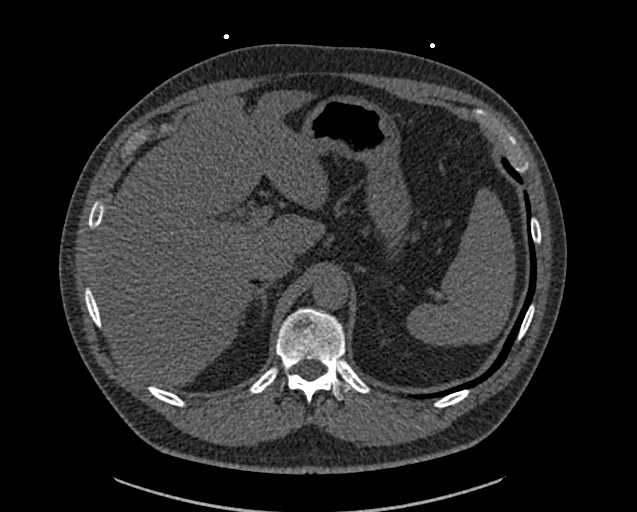
[im 19/109  lung]
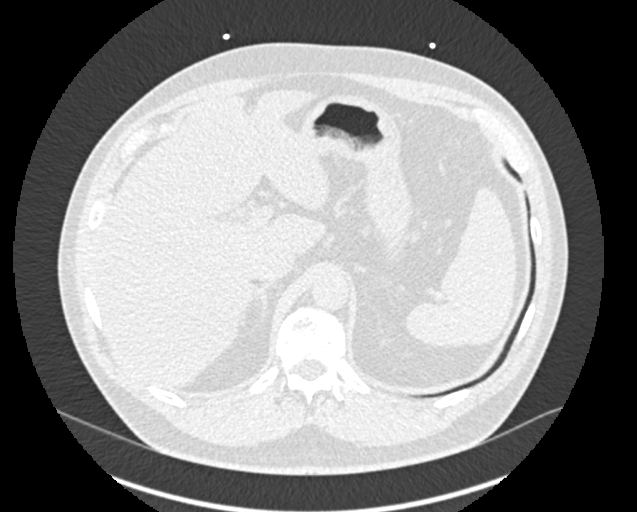
[im 37/109  vessel]
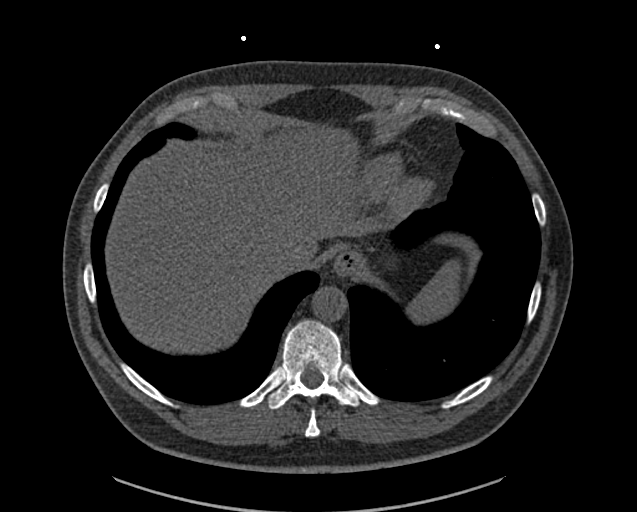
[im 55/109  vessel]
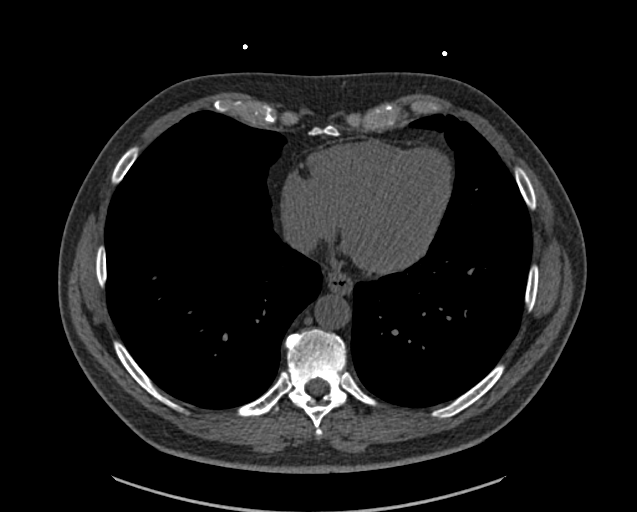
[im 73/109  vessel]
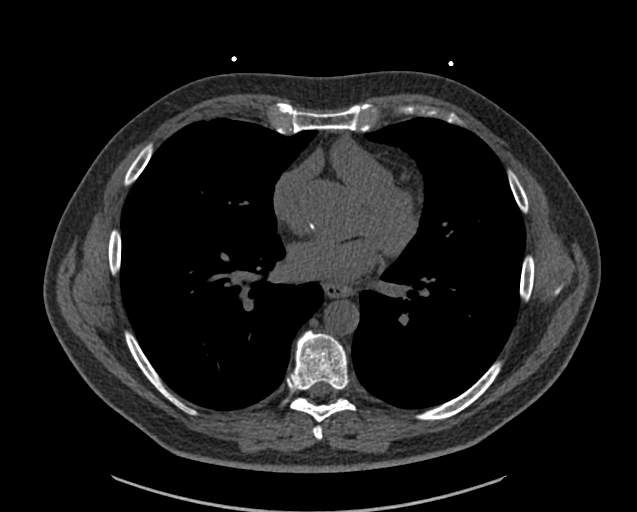
[im 91/109  vessel]
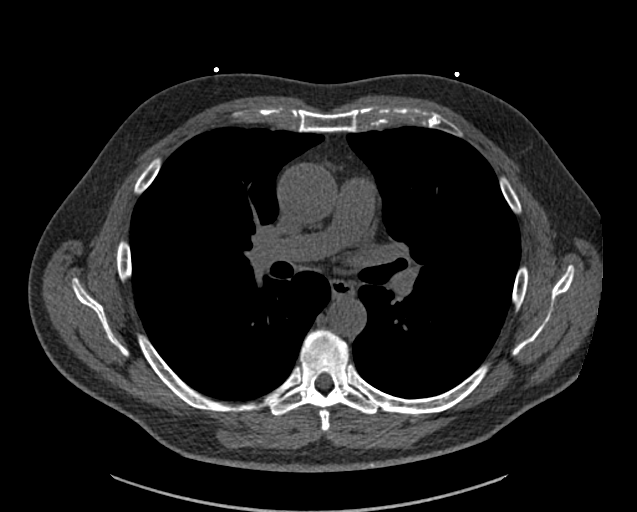
[im 91/109  lung]
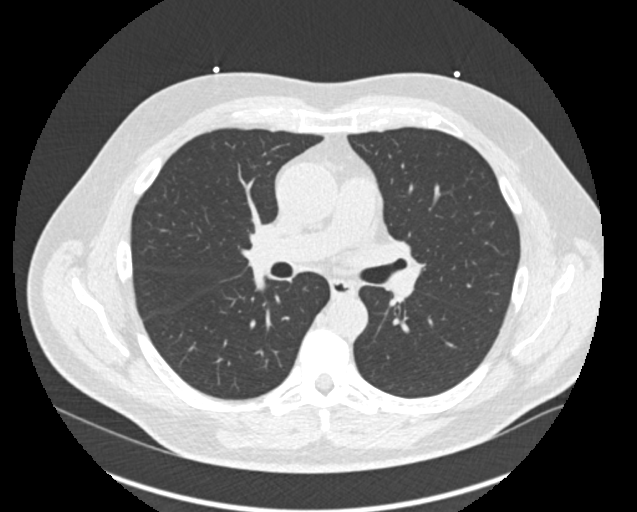

[Series 9: calcium scoring 2.00 br60 bestdiast 70% lungs · axial · 0.66mm/px · z∈[+1734,+1878]mm · 5 of 109 slices shown]
[im 19/109  vessel]
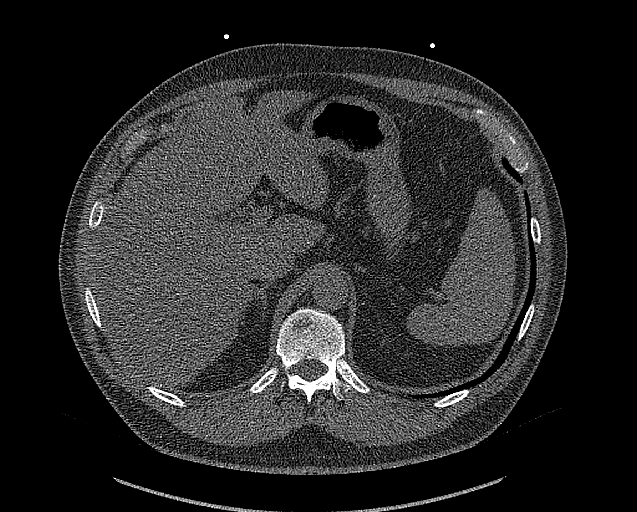
[im 37/109  vessel]
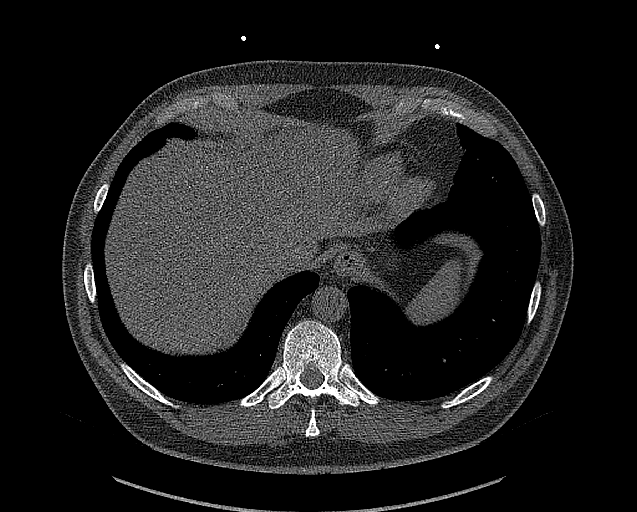
[im 55/109  vessel]
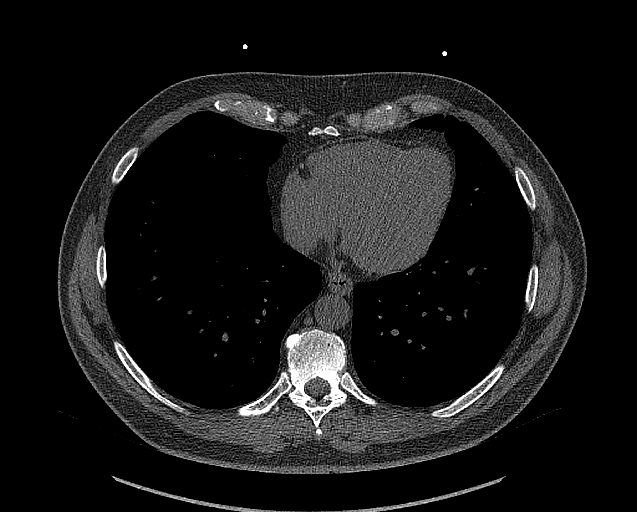
[im 73/109  vessel]
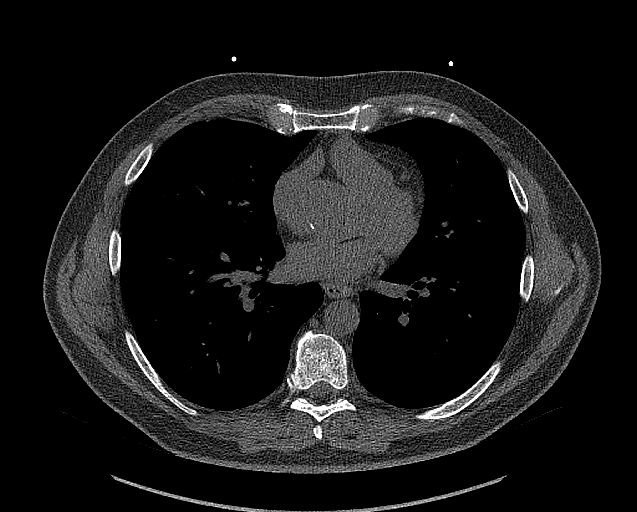
[im 91/109  vessel]
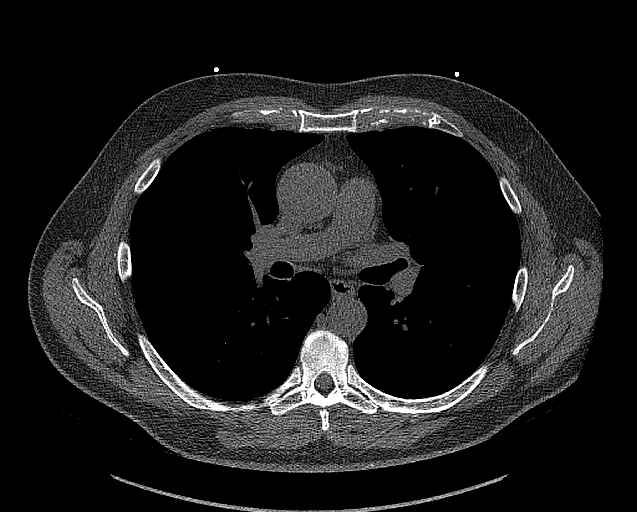

[14 of 20 positions shown; findings below may reference images not displayed]

FINDINGS: CORONARY CALCIUM SCORES:

Left Main: 0

LAD: 176

LCx: 4

RCA: 6

Total Agatston Score: 186

[HOSPITAL] percentile: 68

AORTA MEASUREMENTS:

Ascending Aorta: 38 mm

Descending Aorta: 24 mm

OTHER FINDINGS:

The heart size is within normal limits. No pericardial fluid is
identified. Visualized segments of the thoracic aorta and central
pulmonary arteries are normal in caliber. Visualized mediastinum and
hilar regions demonstrate no lymphadenopathy or masses. Separate 2
mm, 3 mm and 3 mm subpleural nodules abutting the anterior pleural
surface of the right middle lobe are likely postinflammatory based
on appearance. Additional well-circumscribed subpleural nodule in
the posterolateral left lower lobe measures approximately 3 x 4 mm
on image number 60/9. Visualized lungs show no evidence of pulmonary
edema, consolidation, pneumothorax or pleural fluid. The liver
demonstrates steatosis. Visualized bony structures are unremarkable.
IMPRESSION: 1. Coronary calcium score of 186 is at the 68th percentile for the
patient's age, sex and race.
2. 3 x 4 mm left lower lobe subpleural pulmonary nodule. Additional
tiny subpleural nodules of the anterior right middle lobe are likely
postinflammatory.
No routine follow-up imaging is recommended per [HOSPITAL]
Guidelines.
These guidelines do not apply to immunocompromised patients and
patients with cancer. Follow up in patients with significant
comorbidities as clinically warranted. For lung cancer screening,
adhere to Lung-RADS guidelines. Reference: Radiology. 7810;
3. Evidence of hepatic steatosis.

## 2022-04-07 DIAGNOSIS — R69 Illness, unspecified: Secondary | ICD-10-CM | POA: Diagnosis not present

## 2022-04-12 ENCOUNTER — Other Ambulatory Visit (HOSPITAL_COMMUNITY): Payer: Self-pay

## 2022-04-13 DIAGNOSIS — R69 Illness, unspecified: Secondary | ICD-10-CM | POA: Diagnosis not present

## 2022-05-14 DIAGNOSIS — R69 Illness, unspecified: Secondary | ICD-10-CM | POA: Diagnosis not present

## 2022-06-13 DIAGNOSIS — R69 Illness, unspecified: Secondary | ICD-10-CM | POA: Diagnosis not present

## 2022-06-17 DIAGNOSIS — H35033 Hypertensive retinopathy, bilateral: Secondary | ICD-10-CM | POA: Diagnosis not present

## 2022-06-17 DIAGNOSIS — H35373 Puckering of macula, bilateral: Secondary | ICD-10-CM | POA: Diagnosis not present

## 2022-06-17 DIAGNOSIS — H35413 Lattice degeneration of retina, bilateral: Secondary | ICD-10-CM | POA: Diagnosis not present

## 2022-06-17 DIAGNOSIS — H35362 Drusen (degenerative) of macula, left eye: Secondary | ICD-10-CM | POA: Diagnosis not present

## 2022-06-17 DIAGNOSIS — H2513 Age-related nuclear cataract, bilateral: Secondary | ICD-10-CM | POA: Diagnosis not present

## 2022-06-17 DIAGNOSIS — H43813 Vitreous degeneration, bilateral: Secondary | ICD-10-CM | POA: Diagnosis not present

## 2022-06-17 DIAGNOSIS — H59812 Chorioretinal scars after surgery for detachment, left eye: Secondary | ICD-10-CM | POA: Diagnosis not present

## 2022-07-14 DIAGNOSIS — R69 Illness, unspecified: Secondary | ICD-10-CM | POA: Diagnosis not present

## 2022-07-16 DIAGNOSIS — D2262 Melanocytic nevi of left upper limb, including shoulder: Secondary | ICD-10-CM | POA: Diagnosis not present

## 2022-07-16 DIAGNOSIS — L578 Other skin changes due to chronic exposure to nonionizing radiation: Secondary | ICD-10-CM | POA: Diagnosis not present

## 2022-07-16 DIAGNOSIS — D2239 Melanocytic nevi of other parts of face: Secondary | ICD-10-CM | POA: Diagnosis not present

## 2022-07-16 DIAGNOSIS — D2271 Melanocytic nevi of right lower limb, including hip: Secondary | ICD-10-CM | POA: Diagnosis not present

## 2022-07-16 DIAGNOSIS — D225 Melanocytic nevi of trunk: Secondary | ICD-10-CM | POA: Diagnosis not present

## 2022-07-16 DIAGNOSIS — D224 Melanocytic nevi of scalp and neck: Secondary | ICD-10-CM | POA: Diagnosis not present

## 2022-07-16 DIAGNOSIS — L720 Epidermal cyst: Secondary | ICD-10-CM | POA: Diagnosis not present

## 2022-07-16 DIAGNOSIS — D1801 Hemangioma of skin and subcutaneous tissue: Secondary | ICD-10-CM | POA: Diagnosis not present

## 2022-07-16 DIAGNOSIS — L821 Other seborrheic keratosis: Secondary | ICD-10-CM | POA: Diagnosis not present

## 2022-07-16 DIAGNOSIS — D485 Neoplasm of uncertain behavior of skin: Secondary | ICD-10-CM | POA: Diagnosis not present

## 2022-07-16 DIAGNOSIS — D2261 Melanocytic nevi of right upper limb, including shoulder: Secondary | ICD-10-CM | POA: Diagnosis not present

## 2022-08-13 DIAGNOSIS — R69 Illness, unspecified: Secondary | ICD-10-CM | POA: Diagnosis not present

## 2022-09-13 DIAGNOSIS — R69 Illness, unspecified: Secondary | ICD-10-CM | POA: Diagnosis not present

## 2022-09-16 DIAGNOSIS — H04123 Dry eye syndrome of bilateral lacrimal glands: Secondary | ICD-10-CM | POA: Diagnosis not present

## 2022-09-16 DIAGNOSIS — H2513 Age-related nuclear cataract, bilateral: Secondary | ICD-10-CM | POA: Diagnosis not present

## 2022-09-16 DIAGNOSIS — H43813 Vitreous degeneration, bilateral: Secondary | ICD-10-CM | POA: Diagnosis not present

## 2022-09-16 DIAGNOSIS — H35371 Puckering of macula, right eye: Secondary | ICD-10-CM | POA: Diagnosis not present

## 2022-09-19 DIAGNOSIS — Z0100 Encounter for examination of eyes and vision without abnormal findings: Secondary | ICD-10-CM | POA: Diagnosis not present

## 2022-10-14 DIAGNOSIS — R69 Illness, unspecified: Secondary | ICD-10-CM | POA: Diagnosis not present

## 2022-11-13 DIAGNOSIS — R69 Illness, unspecified: Secondary | ICD-10-CM | POA: Diagnosis not present

## 2022-12-14 DIAGNOSIS — R69 Illness, unspecified: Secondary | ICD-10-CM | POA: Diagnosis not present

## 2023-06-23 ENCOUNTER — Other Ambulatory Visit: Payer: Self-pay | Admitting: Medical Genetics

## 2023-07-14 DIAGNOSIS — H31093 Other chorioretinal scars, bilateral: Secondary | ICD-10-CM | POA: Diagnosis not present

## 2023-07-14 DIAGNOSIS — H35373 Puckering of macula, bilateral: Secondary | ICD-10-CM | POA: Diagnosis not present

## 2023-07-14 DIAGNOSIS — H2513 Age-related nuclear cataract, bilateral: Secondary | ICD-10-CM | POA: Diagnosis not present

## 2023-07-14 DIAGNOSIS — H35413 Lattice degeneration of retina, bilateral: Secondary | ICD-10-CM | POA: Diagnosis not present

## 2023-07-14 DIAGNOSIS — H35362 Drusen (degenerative) of macula, left eye: Secondary | ICD-10-CM | POA: Diagnosis not present

## 2023-07-14 DIAGNOSIS — H43813 Vitreous degeneration, bilateral: Secondary | ICD-10-CM | POA: Diagnosis not present

## 2023-07-14 DIAGNOSIS — H35033 Hypertensive retinopathy, bilateral: Secondary | ICD-10-CM | POA: Diagnosis not present

## 2023-08-06 ENCOUNTER — Other Ambulatory Visit: Payer: Self-pay

## 2023-09-15 ENCOUNTER — Other Ambulatory Visit

## 2023-09-15 DIAGNOSIS — Z006 Encounter for examination for normal comparison and control in clinical research program: Secondary | ICD-10-CM

## 2023-09-25 LAB — GENECONNECT MOLECULAR SCREEN: Genetic Analysis Overall Interpretation: NEGATIVE

## 2023-10-21 DIAGNOSIS — Z23 Encounter for immunization: Secondary | ICD-10-CM | POA: Diagnosis not present

## 2023-10-26 DIAGNOSIS — S93402D Sprain of unspecified ligament of left ankle, subsequent encounter: Secondary | ICD-10-CM | POA: Diagnosis not present

## 2023-10-26 DIAGNOSIS — M25572 Pain in left ankle and joints of left foot: Secondary | ICD-10-CM | POA: Diagnosis not present

## 2023-10-26 DIAGNOSIS — S8265XD Nondisplaced fracture of lateral malleolus of left fibula, subsequent encounter for closed fracture with routine healing: Secondary | ICD-10-CM | POA: Diagnosis not present

## 2023-10-30 DIAGNOSIS — S8262XA Displaced fracture of lateral malleolus of left fibula, initial encounter for closed fracture: Secondary | ICD-10-CM | POA: Diagnosis not present

## 2023-11-09 DIAGNOSIS — R2689 Other abnormalities of gait and mobility: Secondary | ICD-10-CM | POA: Diagnosis not present

## 2023-11-09 DIAGNOSIS — S8262XA Displaced fracture of lateral malleolus of left fibula, initial encounter for closed fracture: Secondary | ICD-10-CM | POA: Diagnosis not present

## 2023-11-09 DIAGNOSIS — M25572 Pain in left ankle and joints of left foot: Secondary | ICD-10-CM | POA: Diagnosis not present

## 2023-11-09 DIAGNOSIS — R531 Weakness: Secondary | ICD-10-CM | POA: Diagnosis not present

## 2023-11-13 DIAGNOSIS — S8262XA Displaced fracture of lateral malleolus of left fibula, initial encounter for closed fracture: Secondary | ICD-10-CM | POA: Diagnosis not present

## 2023-11-13 DIAGNOSIS — M25572 Pain in left ankle and joints of left foot: Secondary | ICD-10-CM | POA: Diagnosis not present

## 2023-11-13 DIAGNOSIS — R2689 Other abnormalities of gait and mobility: Secondary | ICD-10-CM | POA: Diagnosis not present

## 2023-11-13 DIAGNOSIS — R531 Weakness: Secondary | ICD-10-CM | POA: Diagnosis not present

## 2023-11-19 DIAGNOSIS — M25572 Pain in left ankle and joints of left foot: Secondary | ICD-10-CM | POA: Diagnosis not present

## 2023-11-19 DIAGNOSIS — H35371 Puckering of macula, right eye: Secondary | ICD-10-CM | POA: Diagnosis not present

## 2023-11-19 DIAGNOSIS — S8262XA Displaced fracture of lateral malleolus of left fibula, initial encounter for closed fracture: Secondary | ICD-10-CM | POA: Diagnosis not present

## 2023-11-19 DIAGNOSIS — H353121 Nonexudative age-related macular degeneration, left eye, early dry stage: Secondary | ICD-10-CM | POA: Diagnosis not present

## 2023-11-19 DIAGNOSIS — H43813 Vitreous degeneration, bilateral: Secondary | ICD-10-CM | POA: Diagnosis not present

## 2023-11-19 DIAGNOSIS — H2513 Age-related nuclear cataract, bilateral: Secondary | ICD-10-CM | POA: Diagnosis not present

## 2023-11-19 DIAGNOSIS — R2689 Other abnormalities of gait and mobility: Secondary | ICD-10-CM | POA: Diagnosis not present

## 2023-11-19 DIAGNOSIS — R531 Weakness: Secondary | ICD-10-CM | POA: Diagnosis not present

## 2023-11-19 DIAGNOSIS — H524 Presbyopia: Secondary | ICD-10-CM | POA: Diagnosis not present

## 2023-11-19 DIAGNOSIS — H5213 Myopia, bilateral: Secondary | ICD-10-CM | POA: Diagnosis not present

## 2023-11-19 DIAGNOSIS — H52223 Regular astigmatism, bilateral: Secondary | ICD-10-CM | POA: Diagnosis not present

## 2023-11-25 DIAGNOSIS — Z0100 Encounter for examination of eyes and vision without abnormal findings: Secondary | ICD-10-CM | POA: Diagnosis not present

## 2023-11-26 DIAGNOSIS — S8262XA Displaced fracture of lateral malleolus of left fibula, initial encounter for closed fracture: Secondary | ICD-10-CM | POA: Diagnosis not present

## 2023-11-26 DIAGNOSIS — R531 Weakness: Secondary | ICD-10-CM | POA: Diagnosis not present

## 2023-11-26 DIAGNOSIS — R2689 Other abnormalities of gait and mobility: Secondary | ICD-10-CM | POA: Diagnosis not present

## 2023-11-26 DIAGNOSIS — M25572 Pain in left ankle and joints of left foot: Secondary | ICD-10-CM | POA: Diagnosis not present

## 2023-11-30 DIAGNOSIS — S8262XA Displaced fracture of lateral malleolus of left fibula, initial encounter for closed fracture: Secondary | ICD-10-CM | POA: Diagnosis not present

## 2023-12-02 DIAGNOSIS — R531 Weakness: Secondary | ICD-10-CM | POA: Diagnosis not present

## 2023-12-02 DIAGNOSIS — R2689 Other abnormalities of gait and mobility: Secondary | ICD-10-CM | POA: Diagnosis not present

## 2023-12-02 DIAGNOSIS — M25572 Pain in left ankle and joints of left foot: Secondary | ICD-10-CM | POA: Diagnosis not present

## 2023-12-02 DIAGNOSIS — S8262XA Displaced fracture of lateral malleolus of left fibula, initial encounter for closed fracture: Secondary | ICD-10-CM | POA: Diagnosis not present

## 2023-12-07 DIAGNOSIS — Z1211 Encounter for screening for malignant neoplasm of colon: Secondary | ICD-10-CM | POA: Diagnosis not present

## 2023-12-07 DIAGNOSIS — Z1212 Encounter for screening for malignant neoplasm of rectum: Secondary | ICD-10-CM | POA: Diagnosis not present

## 2023-12-17 DIAGNOSIS — M25572 Pain in left ankle and joints of left foot: Secondary | ICD-10-CM | POA: Diagnosis not present

## 2023-12-17 DIAGNOSIS — R2689 Other abnormalities of gait and mobility: Secondary | ICD-10-CM | POA: Diagnosis not present

## 2023-12-17 DIAGNOSIS — R531 Weakness: Secondary | ICD-10-CM | POA: Diagnosis not present

## 2023-12-17 DIAGNOSIS — S8262XA Displaced fracture of lateral malleolus of left fibula, initial encounter for closed fracture: Secondary | ICD-10-CM | POA: Diagnosis not present

## 2024-01-04 DIAGNOSIS — S8262XA Displaced fracture of lateral malleolus of left fibula, initial encounter for closed fracture: Secondary | ICD-10-CM | POA: Diagnosis not present

## 2024-01-04 DIAGNOSIS — M25572 Pain in left ankle and joints of left foot: Secondary | ICD-10-CM | POA: Diagnosis not present

## 2024-01-04 DIAGNOSIS — R531 Weakness: Secondary | ICD-10-CM | POA: Diagnosis not present

## 2024-01-04 DIAGNOSIS — R2689 Other abnormalities of gait and mobility: Secondary | ICD-10-CM | POA: Diagnosis not present
